# Patient Record
Sex: Male | Born: 1955 | Race: White | Hispanic: No | Marital: Married | Smoking: Former smoker
Health system: Southern US, Community
[De-identification: ages and names within clinical notes are randomized; demographics above are authoritative.]

## PROBLEM LIST (undated history)

## (undated) DIAGNOSIS — Z9889 Other specified postprocedural states: Secondary | ICD-10-CM

## (undated) DIAGNOSIS — E119 Type 2 diabetes mellitus without complications: Secondary | ICD-10-CM

## (undated) DIAGNOSIS — I499 Cardiac arrhythmia, unspecified: Secondary | ICD-10-CM

## (undated) DIAGNOSIS — I4891 Unspecified atrial fibrillation: Secondary | ICD-10-CM

## (undated) HISTORY — DX: Type 2 diabetes mellitus without complications: E11.9

## (undated) HISTORY — DX: Unspecified atrial fibrillation: I48.91

---

## 2002-11-06 ENCOUNTER — Ambulatory Visit (HOSPITAL_COMMUNITY): Admission: RE | Admit: 2002-11-06 | Discharge: 2002-11-06 | Payer: Self-pay | Admitting: Otolaryngology

## 2002-11-06 ENCOUNTER — Encounter: Payer: Self-pay | Admitting: Otolaryngology

## 2008-06-23 ENCOUNTER — Encounter: Payer: Self-pay | Admitting: Cardiology

## 2008-07-31 ENCOUNTER — Ambulatory Visit: Payer: Self-pay | Admitting: Cardiology

## 2008-07-31 ENCOUNTER — Encounter: Payer: Self-pay | Admitting: Cardiology

## 2008-07-31 ENCOUNTER — Ambulatory Visit (HOSPITAL_COMMUNITY): Admission: RE | Admit: 2008-07-31 | Discharge: 2008-07-31 | Payer: Self-pay | Admitting: Cardiology

## 2008-08-07 ENCOUNTER — Ambulatory Visit: Payer: Self-pay | Admitting: Cardiology

## 2008-08-08 ENCOUNTER — Ambulatory Visit: Payer: Self-pay | Admitting: Cardiology

## 2008-08-12 ENCOUNTER — Encounter (HOSPITAL_COMMUNITY): Admission: RE | Admit: 2008-08-12 | Discharge: 2008-08-27 | Payer: Self-pay | Admitting: Cardiology

## 2008-08-12 ENCOUNTER — Ambulatory Visit: Payer: Self-pay | Admitting: Cardiology

## 2008-08-20 ENCOUNTER — Ambulatory Visit: Payer: Self-pay | Admitting: Cardiology

## 2008-08-28 ENCOUNTER — Ambulatory Visit (HOSPITAL_BASED_OUTPATIENT_CLINIC_OR_DEPARTMENT_OTHER): Admission: RE | Admit: 2008-08-28 | Discharge: 2008-08-28 | Payer: Self-pay | Admitting: Family Medicine

## 2008-09-07 ENCOUNTER — Ambulatory Visit: Payer: Self-pay | Admitting: Internal Medicine

## 2009-02-09 DIAGNOSIS — I4891 Unspecified atrial fibrillation: Secondary | ICD-10-CM

## 2009-07-27 ENCOUNTER — Telehealth: Payer: Self-pay | Admitting: Cardiology

## 2009-10-09 ENCOUNTER — Ambulatory Visit: Payer: Self-pay | Admitting: Cardiology

## 2009-10-09 DIAGNOSIS — E669 Obesity, unspecified: Secondary | ICD-10-CM

## 2009-10-09 DIAGNOSIS — Z91199 Patient's noncompliance with other medical treatment and regimen due to unspecified reason: Secondary | ICD-10-CM | POA: Insufficient documentation

## 2009-10-09 DIAGNOSIS — Z9119 Patient's noncompliance with other medical treatment and regimen: Secondary | ICD-10-CM

## 2009-11-06 ENCOUNTER — Encounter: Payer: Self-pay | Admitting: Cardiology

## 2009-11-06 ENCOUNTER — Ambulatory Visit: Payer: Self-pay | Admitting: Cardiology

## 2009-11-06 ENCOUNTER — Ambulatory Visit (HOSPITAL_COMMUNITY): Admission: RE | Admit: 2009-11-06 | Discharge: 2009-11-06 | Payer: Self-pay | Admitting: Cardiology

## 2009-11-09 ENCOUNTER — Encounter: Payer: Self-pay | Admitting: Cardiology

## 2009-11-09 DIAGNOSIS — R0602 Shortness of breath: Secondary | ICD-10-CM | POA: Insufficient documentation

## 2010-01-29 ENCOUNTER — Telehealth (INDEPENDENT_AMBULATORY_CARE_PROVIDER_SITE_OTHER): Payer: Self-pay

## 2010-08-21 IMAGING — NM NM MYOCAR MULTI W/ SPECT
2 series · 12 of 12 positions shown · non-contrast
Comparison: none

08/13/08 - DUPLICATE COPY for exam association in [facility] – No change from original report.
 Ordering Physician: Fadumo Baber

 [place] Physician: [REDACTED]al Data: 52-year-old gentleman with paroxysmal atrial
 fibrillation.
 NUCLEAR MEDICINE STRESS MYOVIEW STUDY WITH SPECT AND LEFT
 VENTRICULAR EJECTION FRACTION
 Radionuclide Data: One-day rest/stress protocol performed with
 [DATE] mCi of Uc-DDm Myoview.
 Stress Data: Treadmill exercise performed to a workload of 11 mets
 and a heart rate of 162, 96% of age - predicted maximum. Exercise
 discontinued due to dyspnea and fatigue; no chest pain reported.
 Blood pressure increased from a resting value of 130/90 to 170/80
 during exercise and 180/80 in recovery, a normal response. No
 arrhythmias noted.
 EKG: Normal sinus rhythm; incomplete right bundle branch block;
 borderline first-degree AV block; otherwise normal.
 Stress EKG: Insignificant upsloping ST-segment depression.
 Scintigraphic Data: Acquisition notable for moderate diaphragmatic
 attenuation. The left ventricular size was normal. The right
 ventricle was dilated. On tomographic images reconstructed in
 standard planes, there was a small mild defect in the mid anterior
 wall. This was unchanged on the resting images. The gated
 reconstruction demonstrated normal regional and global LV systolic
 function as well as normal systolic accentuation of activity
 throughout. Estimated ejection fraction was 66%.

[Series 1: cr cardiac tc low dose · 6.52mm/px · 6 of 64 frames shown]
[frame 6/64]
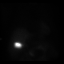
[frame 16/64]
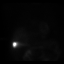
[frame 27/64]
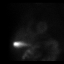
[frame 38/64]
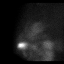
[frame 48/64]
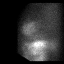
[frame 59/64]
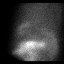

[Series 1: cs cardiac tc hi dose · 6.52mm/px · 6 of 512 frames shown]
[frame 43/512]
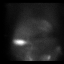
[frame 128/512]
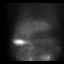
[frame 214/512]
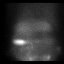
[frame 299/512]
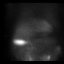
[frame 384/512]
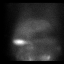
[frame 470/512]
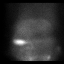

[12 of 12 positions shown; findings below may reference images not displayed]

IMPRESSION: Negative stress nuclear myocardial study revealing no significant
 stress - induced EKG abnormalities, normal left ventricular size
 and normal left ventricular systolic function. By scintigraphic
 imaging, there was normal myocardial perfusion except for a small
 defect consistent with a right ventricular insertion artifact.
 Right ventricular enlargement noted. Other findings as described.

## 2010-09-28 NOTE — Progress Notes (Signed)
**Note De-Identified Bohdi Leeds Obfuscation** Summary: RX REFILL   Phone Note Call from Patient Call back at Home Phone [phone]   Caller: PT Reason for Call: Refill Medication Summary of Call: PT NEEDS FLONASE CALLED INTO [facility]    Prescriptions: FLONASE 50 MCG/ACT SUSP (FLUTICASONE PROPIONATE) one spray each nostril daily  #16 Gram x 1   Entered by:   Larita Fife Raysa Bosak LPN   Authorized by:   Gaylord Shih, MD, [facility]   Signed by:   Larita Fife Maya Scholer LPN on 96/29/5284   Method used:   Electronically to        [facility] # [phone]* (retail)       [address]       [place], [place]  [address]       Ph: [phone]       Fax: [phone]   RxID:   [id number]

## 2010-09-28 NOTE — Miscellaneous (Signed)
Summary: Orders Update  Clinical Lists Changes  Problems: Added new problem of DYSPNEA (ICD-786.05) Orders: Added new Test order of T-D-Dimer Fibrin Derivatives Quantitive [phone]) - Signed

## 2010-09-28 NOTE — Assessment & Plan Note (Signed)
Summary: ROV  Medications Added DILTIAZEM HCL ER BEADS 360 MG XR24H-CAP (DILTIAZEM HCL ER BEADS) take 1 tablet by mouth once daily ASPIRIN EC 325 MG TBEC (ASPIRIN) Take one tablet by mouth daily FISH OIL 1000 MG CAPS (OMEGA-3 FATTY ACIDS) Take 1 tablet by mouth once a day ONE-A-DAY WEIGHT SMART ADVANCE  TABS (MULTIPLE VITAMINS-MINERALS) Take 1 tablet by mouth once a day ZYRTEC ALLERGY 10 MG TABS (CETIRIZINE HCL) Take 1 tablet by mouth once a day ASPIRIN 81 MG TBEC (ASPIRIN) Take one tablet by mouth daily      Allergies Added: NKDA  Visit Type:  ROV Primary Provider:  Dr. Selinda Flavin  CC:  no c/o.  History of Present Illness: Mr Klarich returns today for further evaluation and management of his paroxysmal atrial fibrillation. He denies any palpitations or irregular heartbeat.  He's gained about 35 pounds since we last saw him about 13 months ago. He does not have bathroom scale  He seems to be compliant with his medications that he does not check his blood pressure.  Looking back at his chart, he had a normal 2-D echocardiogram we first met about a year and half ago.  Current Medications (verified): 1)  Diltiazem Hcl Er Beads 360 Mg Xr24h-Cap (Diltiazem Hcl Er Beads) .... Take 1 Tablet By Mouth Once Daily 2)  Flonase 50 Mcg/act Susp (Fluticasone Propionate) .... One Spray Each Nostril Daily 3)  Aspirin Ec 325 Mg Tbec (Aspirin) .... Take One Tablet By Mouth Daily 4)  Fish Oil 1000 Mg Caps (Omega-3 Fatty Acids) .... Take 1 Tablet By Mouth Once A Day 5)  One-A-Day Weight Smart Advance  Tabs (Multiple Vitamins-Minerals) .... Take 1 Tablet By Mouth Once A Day 6)  Zyrtec Allergy 10 Mg Tabs (Cetirizine Hcl) .... Take 1 Tablet By Mouth Once A Day 7)  Aspirin 81 Mg Tbec (Aspirin) .... Take One Tablet By Mouth Daily  Allergies (verified): No Known Drug Allergies  Past History:  Past Medical History: Last updated: 02/09/2009 ATRIAL FIBRILLATION (ICD-427.31)    Family History: Last  updated: 02/09/2009 CHF Heart disease  Social History: Last updated: 02/09/2009 Married  Tobacco Use - No.  Regular Exercise - no Drug Use - no  Risk Factors: Exercise: no (02/09/2009)  Risk Factors: Smoking Status: never (02/09/2009)  Review of Systems       negative other than history of present illness  Vital Signs:  Patient profile:   55 year old male Height:      77 inches Weight:      336 pounds BMI:     39.99 O2 Sat:      96 % on Room air Pulse rate:   97 / minute BP sitting:   126 / 90  (left arm)  Vitals Entered By: Teressa Lower RN (October 09, 2009 1:17 PM)  Nutrition Counseling: Patient's BMI is greater than 25 and therefore counseled on weight management options.  O2 Flow:  Room air  Physical Exam  General:  obese.   Head:  normocephalic and atraumatic Eyes:  wears glass Mouth:  poor dentition.   Neck:  Neck supple, no JVD. No masses, thyromegaly or abnormal cervical nodes. Lungs:  Clear bilaterally to auscultation and percussion. Heart:  irregular rate and rhythm, right less than 100. PMI difficult to appreciate. S2 split Msk:  decreased ROM.   Pulses:  pulses normal in all 4 extremities Extremities:  trace left pedal edema and trace right pedal edema.   Neurologic:  Alert and oriented x  3. Skin:  Intact without lesions or rashes. Psych:  Normal affect.   EKG  Procedure date:  10/09/2009  Findings:      echo for ablation, rate 90-100 beats per minute. No ST segment changes.  Impression & Recommendations:  Problem # 1:  ATRIAL FIBRILLATION (ICD-427.31)  He is in atrial fibrillation which is asymptomatic with a fairly well controlled ventricular rate. I suspect with any activity however his rate increases rheumatically. We will start by increasing his diltiazem to 360 mg a day for both ventricular rate control and blood pressure. However return to see me in about 4-6 weeks at which time we'll perform a 2-D echocardiogram. In addition he  will remain on aspirin 325 mg per day. if he doesn't convert, we may need to consider TE cardioversion. He'll not start Coumadin today with a fairly low [place] score. His updated medication list for this problem includes:    Aspirin Ec 325 Mg Tbec (Aspirin) .Marland Kitchen... Take one tablet by mouth daily    Aspirin 81 Mg Tbec (Aspirin) .Marland Kitchen... Take one tablet by mouth daily  Orders: 2-D Echocardiogram (2D Echo)  Problem # 2:  OBESITY (ICD-278.00) Assessment: Deteriorated This is a major problem. I spent extensive time talking to him about the importance of weight reduction in terms of blood pressure control, orthopedic issues, the risk of developing diabetes etc.  Problem # 3:  PERS HX NONCOMPLIANCE W/MED TX PRS HAZARDS HLTH (ICD-V15.81)  Patient Instructions: 1)  Your physician recommends that you schedule a follow-up appointment in: 4 weeks 2)  Your physician has recommended you make the following change in your medication: Increase Diltiazem 360mg  and start taking Aspirin 325mg  by mouth once daily  3)  Your physician has requested that you have an echocardiogram.  Echocardiography is a painless test that uses sound waves to create images of your heart. It provides your doctor with information about the size and shape of your heart and how well your heart's chambers and valves are working.  This procedure takes approximately one hour. There are no restrictions for this procedure. 4)  Your physician encouraged you to lose weight for better health. Prescriptions: DILTIAZEM HCL ER BEADS 360 MG XR24H-CAP (DILTIAZEM HCL ER BEADS) take 1 tablet by mouth once daily  #30 x 6   Entered by:   Larita Fife Via LPN   Authorized by:   Gaylord Shih, MD, [facility]   Signed by:   Larita Fife Via LPN on 57/84/6962   Method used:   Electronically to        [facility] # [phone]* (retail)       [address]       [place], [place]  [address]       Ph: [phone]       Fax: [phone]   RxID:   [id number]

## 2010-09-28 NOTE — Assessment & Plan Note (Signed)
Summary: ROV      Allergies Added: NKDA  Visit Type:  Follow-up Primary Provider:  Dr. Selinda Flavin  CC:  no complaints today.  History of Present Illness: Ryan Lowe returns today for his paroxysmal atrial fibrillation and obesity.  He is still totally asymptomatic. He has lost 12 pounds since February. He seems to be quite motivated.  Unfortunately, his echocardiogram showed EF of 50% with mild LVH. He has a moderately dilated left atrium, moderate to severe dilatation the right ventricle, severely reduced systolic function the right ventricle, moderate severe dilatation the right atrium, and peak pressure of 34 mm of mercury.  He denies any chest pain, hemoptysis, or significant shortness of breath. He denies any edema or leg pain.  Current Medications (verified): 1)  Diltiazem Hcl Er Beads 360 Mg Xr24h-Cap (Diltiazem Hcl Er Beads) .... Take 1 Tablet By Mouth Once Daily 2)  Flonase 50 Mcg/act Susp (Fluticasone Propionate) .... One Spray Each Nostril Daily 3)  Aspirin Ec 325 Mg Tbec (Aspirin) .... Take One Tablet By Mouth Daily 4)  Fish Oil 1000 Mg Caps (Omega-3 Fatty Acids) .... Take 1 Tablet By Mouth Once A Day 5)  One-A-Day Weight Smart Advance  Tabs (Multiple Vitamins-Minerals) .... Take 1 Tablet By Mouth Once A Day 6)  Zyrtec Allergy 10 Mg Tabs (Cetirizine Hcl) .... Take 1 Tablet By Mouth Once A Day  Allergies (verified): No Known Drug Allergies  Past History:  Past Medical History: Last updated: 02/09/2009 ATRIAL FIBRILLATION (ICD-427.31)    Family History: Last updated: 02/09/2009 CHF Heart disease  Social History: Last updated: 02/09/2009 Married  Tobacco Use - No.  Regular Exercise - no Drug Use - no  Risk Factors: Exercise: no (02/09/2009)  Risk Factors: Smoking Status: never (02/09/2009)  Review of Systems       negative other than history of present illness  Vital Signs:  Patient profile:   55 year old male Weight:      324 pounds Pulse  rate:   88 / minute BP sitting:   107 / 63  (right arm)  Vitals Entered By: Dreama Saa, CNA (November 06, 2009 1:52 PM)  Physical Exam  General:  obese.   Head:  normocephalic and atraumatic Eyes:  PERRLA/EOM intact; conjunctiva and lids normal. Neck:  Neck supple, no JVD. No masses, thyromegaly or abnormal cervical nodes. Lungs:  Clear bilaterally to auscultation and percussion. Heart:  he regular rate and rhythm, PMI heart appreciate, no obvious right ventricular lift Msk:  Back normal, normal gait. Muscle strength and tone normal. Pulses:  pulses normal in all 4 extremities Extremities:  trace left pedal edema and trace right pedal edema.   Neurologic:  Alert and oriented x 3. Skin:  Intact without lesions or rashes. Psych:  Normal affect.   Impression & Recommendations:  Problem # 1:  ATRIAL FIBRILLATION (ICD-427.31) Assessment Improved His ventricular rate is much better control. He is totally asymptomatic. With his right atrial enlargement, moderate pulmonary hypertension, dilatation his right ventricle, I'm concerned that this is related to his obesity. There is no history to suggest a pulmonary embolus or thromboembolic disease.  He has lost 12 pounds and seems committed to get his weight well below 300. At this time we'll continue with aspirin and diltiazem. Cardioversion would most likely be unsuccessful long-term for even short term. I not sure that antiarrhythmic therapy would be successful either let her use something like amiodarone. We'll hold off for now. The following medications were removed from the  medication list:    Aspirin 81 Mg Tbec (Aspirin) .Marland Kitchen... Take one tablet by mouth daily His updated medication list for this problem includes:    Aspirin Ec 325 Mg Tbec (Aspirin) .Marland Kitchen... Take one tablet by mouth daily  Problem # 2:  OBESITY (ICD-278.00) Assessment: Improved  Patient Instructions: 1)  Your physician recommends that you schedule a follow-up appointment  in: June 2)  Your physician recommends that you continue on your current medications as directed. Please refer to the Current Medication list given to you today.  Appended Document: ROV His Echo in 2009 showed only mild RV enlargement. Since then has gained 35 lbs. Will check a d-dimer .

## 2011-01-11 NOTE — Assessment & Plan Note (Signed)
[facility]                       [place] [facility] OFFICE NOTE   Ryan Lowe, Ryan Lowe                        MRN:          [id number]  DATE:08/20/2008                            DOB:          04/22/1956    Mr. Vi comes in today for followup.   Since starting diltiazem extended release 240 mg a day, he feels  remarkably better.  He has had 1 little brief episode of atrial fib,  otherwise been normal.  He is not taking his Zyrtec-D.  At the present  time, we would like to start that back.  I told him, I would rather, he  talk to Dr. Dimas Aguas, consider a pulmonary consultation.  He would also  like to sleep consultation as well.  I have offered to set him up in  pulmonary in our Sleep Lab at [facility].  He can also go to Dr. Dimas Aguas.   His exercise stress test showed good exercise tolerance with a met level  of 11, achieving a heart rate of 162 which is 96% of age-predicted  maximum, normal blood pressure response, no arrhythmias, EF 66%, no  ischemia or infarction.   PHYSICAL EXAMINATION:  VITAL SIGNS:  His blood pressure today is 128/82,  his pulse is 76 and regular, and his weight is 305.  The rest of his  exam is unchanged.   I have answered multiple questions for Mr. Coate and his wife.  I have  advised him to continue his diltiazem extended release for now.  If he  begins to breakthrough on a regular basis, we can go to a tier II with a  drug such as flecainide or propafenone.  I will see him back in 3 months  for followup concerning this issue.   Before he left, he and his wife wanted me to set up an appointment with  [facility], [facility] to evaluate the above issues.  We will  try to get him in as soon as possible.     Thomas C. Daleen Squibb, MD, [facility]  Electronically Signed    TCW/MedQ  DD: 08/20/2008  DT: 08/20/2008  Job #: [id number]   cc:   Selinda Flavin

## 2011-01-11 NOTE — Assessment & Plan Note (Signed)
[facility]                       [place] CARDIOLOG[facility] OFFICE NOTE   WILHELM, GANAWAY                        MRN:          [id number]  DATE:08/08/2008                            DOB:          08-12-1956    Ryan Lowe comes in today for close followup.  Please see my note from  July 31, 2008.   We had placed an event recorder which documents atrial fibrillation with  a rapid ventricular rate.  During his 23 hours and 7 minutes of wearing  the monitor, he had a total of 2 hours and 58 minutes of atrial fib.  His longest duration was 2 minutes at 11:40 at night.  His highest heart  rate was 180, but most strips look like about 140-160.   We stopped his Zyrtec-D once we saw this recording.  He is taking  pseudoephedrine derivatives for years for chronic allergies and  congestion.  Since stopping this, he has noticed a decrease in the  events, but he still had 1 this week.   We also performed a 2-D echocardiogram to rule out any structural heart  disease.  This shows normal left ventricular chamber size, mild left  atrial enlargement of 48-mm (but he is a large man by the way), mild  fibrocalcific changes of the aortic root and mild calcification of  aortic valve, but no stenosis, no significant mitral valve  regurgitation, and normal right-sided pressures.   His exam today shows a blood pressure of 120/90.  His pulse is 66 and  regular.  His weight is 298.   I have spent about 30 minutes this morning discussing with him and his  wife, his diagnosis of paroxysmal atrial fibrillation, rapid ventricular  rate which is very symptomatic.  He is sensing that he can predict some  of these spells is outlined in my first note.  Some of them he cannot  predict, however.   We talked about trying a first-year antiarrhythmic with diltiazem.  I  would be reluctant to try a beta-blocker with his history of respiratory  abnormalities.  He does not have any  history of asthma, however.   If he fails this, we would probably need to move to tier-2 with  antiarrhythmics.  Saying that this is highly possible, we need to rule  out any significant coronary artery disease.  We will therefore set him  up for an exercise rest/stress Myoview next week.   He will continue with aspirin for the present time.   PLAN:  1. Begin diltiazem extended release 240 mg a day.  2. Continue not to take Zyrtec-D.  We switched him to Flonase.  3. Exercise rest/stress Myoview next week.  4. Follow up with me in the week of Christmas.  If he continues to      breakthrough and he has a negative stress Myoview, we will start      with outpatient flecainide.     Thomas C. Daleen Squibb, MD, [facility]  Electronically Signed    TCW/MedQ  DD: 08/08/2008  DT: 08/09/2008  Job #: [id number]   cc:   Ryan Lowe  Ryan Lowe

## 2011-01-11 NOTE — Assessment & Plan Note (Signed)
[facility]                       [place] C[facility]IOLOG[facility] OFFICE NOTE   Ryan Lowe, Ryan Lowe                        MRN:          [id number]  DATE:07/31/2008                            DOB:          1956/04/29    I was asked by Dr. Selinda Flavin to consult on Ryan Lowe with the chief  complaint of palpitations.   [name] is a 55 year old married white male who comes in today  with the above complaint.  He has had these over the past several years,  but they have gotten progressively worse.  He describes them as  occurring when he is really doing not much of anything, usually late  afternoon or early evening.  These, however, wake him up at night.  They  generally come on quite suddenly, and he feels like his heart is  speeding fast and irregular.  They can last upto 24 hours.  He becomes  lightheaded and sometimes sweaty.  He has not had syncope.  He has had  no chest discomfort or angina.   He notes that it is particularly likely to happen if he eats dessert or  anything with a lot of sugar in it.  It is also true when he eats  chocolate.  He has particularly found to have these when he eats  anything at the [employer].   He is seeing Dr. Dimas Aguas, who performed an EKG, which is normal except  for complete right bundle.  Specifically, his PR, QRS, and QTC intervals  are normal.  His blood work was also unremarkable including a CBC,  potassium was normal, and a TSH was normal.   He has been taking Zyrtec-D 1 p.o. b.i.d. for allergies.  He has been  taking some sort of Sudafed component for the last 20 years, off and on.  He is concerned this might be triggering as well.   PAST MEDICAL HISTORY:   ALLERGIES:  He has no known drug allergies.   MEDICATIONS:  He is currently on;  1. Aspirin 325 mg alternating with 81 mg every other day.  2. Fish oil 1200 mg a day.  3. Vitamin D3.  4. Vitamin C.  5. Zyrtec-D 1 p.o. b.i.d.   SOCIAL  HISTORY:  He does not smoke.  He does not drink.   PAST SURGICAL HISTORY:  Only surgery is a colonoscopy.   FAMILY HISTORY:  Significant for some heart failure in mother and  father.  He has 4 sisters, who all have heart issues.   SOCIAL HISTORY:  He is unemployed.  He is married.  He has 1 child.  His  wife is with him today.   REVIEW OF SYSTEMS:  He wears glasses.  He has urinary frequency and  urgency, particularly associated with these spells or after these  spells.   The rest review of systems are negative and all are questioned.   PHYSICAL EXAMINATION:  VITAL SIGNS:  His blood pressure is 110/80.  His  pulse is 80 and regular.  He is 6 feet 5 inches and weight is 298  pounds.  GENERAL:  He is in no acute distress.  He is pleasant.  HEENT:  Normocephalic and atraumatic.  PERRLA.  Extraocular movements  are intact.  Sclerae are clear.  Facial symmetry is normal.  Dentition  is satisfactory.  Oral mucosa normal.  NECK:  Supple.  Carotids upstrokes are equal bilaterally without bruits.  No JVD.  Thyroid is not enlarged.  Trachea is midline.  LUNGS:  Clear to auscultation and percussion.  HEART:  Poorly appreciated PMI.  He has a soft S1 and S2.  No obvious  murmur, gallop, or rub.  ABDOMEN:  Protuberant.  Good bowel sounds.  No midline bruit.  No  tenderness.  EXTREMITIES:  There is no cyanosis, clubbing, or edema.  Pulses are  intact.  NEUROLOGIC:  Intact.  SKIN:  Unremarkable.   ASSESSMENT:  Clinical supraventricular tachycardia, though he states he  feels somewhat irregular.  All the other associated features seemed to  suggest this, however.  It could be atrial flutter or it could even be  atrial fibrillation as well.   PLAN:  1. A 2-D echocardiogram to rule out any structural heart disease.  2. Event recorder.   I will schedule followup after he has a couple of spells that we can  monitor.  We will not change any medications at present including his  Zyrtec-D.    We have had a long talk today about possibility of ablation being a  potentially curative procedure.  All questions were answered.     Thomas C. Daleen Squibb, MD, [facility]  Electronically Signed    TCW/MedQ  DD: 07/31/2008  DT: 08/01/2008  Job #: [id number]   cc:   Selinda Flavin

## 2011-01-14 NOTE — Procedures (Signed)
NAME:  Ryan Lowe, MATTERS NO.:  [id number]   MEDICAL RECORD NO.:  [id number]          PATIENT TYPE:  OUT   LOCATION:  SLEEP CENTER                 FACILITY:  [facility]   PHYSICIAN:  Clinton D. Maple Hudson, MD, FCCP, FACPDATE OF BIRTH:  08-13-1956   DATE OF STUDY:                            NOCTURNAL POLYSOMNOGRAM   REFERRING PHYSICIAN:   REFERRING PHYSICIAN:  Dr. Selinda Flavin   INDICATION FOR STUDY:  Hypersomnia with sleep apnea.   EPWORTH SLEEPINESS SCORE:  Epworth sleepiness score 5/24.  BMI 36.  Weight 300 pounds.  Height 77 inches.  Neck 17.5 inches.   MEDICATIONS:  Home medications are charted and reviewed.   SLEEP ARCHITECTURE:  Total sleep time 304 minutes with sleep efficiency  74.2%.  Stage I was 5.1%.  Stage II 65%.  Stage III absent.  REM 29.9%  of total sleep time.  Sleep latency 82 minutes.  REM latency 64 minutes.  Awake after sleep onset 11.5 minutes.  Arousal index 23.8.  No bedtime  medication was taken.   RESPIRATORY DATA:  Apnea-hypopnea index (AHI) 9.1 per hour.  A total of  46 events were scored including 11 obstructive apneas, 1 mixed apnea,  and 34 hypopneas.  Events were not positional but more common while in  supine as expected.  There were insufficient events in the early part of  the night permit CPAP titration by split protocol on the study night.   OXYGEN DATA:  Loud snoring with oxygen desaturation to a nadir of 86%.  Mean oxygen saturation through the study was 95% on room air.   CARDIAC DATA:  Normal sinus rhythm.   MOVEMENT-PARASOMNIA:  No significant movement disturbance.  Bathroom x2.   IMPRESSIONS-RECOMMENDATIONS:  1. Mild obstructive sleep apnea/hypopnea syndrome, apnea-hypopnea      index 9.1 per hour.  Events were noted in all sleep positions but      more common while supine.  Loud snoring with oxygen desaturation to      a nadir of 86%.  2. There were insufficient early events to permit CPAP titration by      split-study  protocol on this night.  Consider return for CPAP      titration or evaluate for alternative management as appropriate.      Clinton D. Maple Hudson, MD, [facility], FACP  Diplomate, Biomedical engineer of Sleep Medicine  Electronically Signed     CDY/MEDQ  D:  09/06/2008 10:28:00  T:  09/06/2008 23:47:25  Job:  [id number]

## 2011-03-25 ENCOUNTER — Other Ambulatory Visit: Payer: Self-pay | Admitting: Cardiology

## 2011-08-25 ENCOUNTER — Encounter: Payer: Self-pay | Admitting: Cardiology

## 2011-09-02 ENCOUNTER — Other Ambulatory Visit: Payer: Self-pay | Admitting: Cardiology

## 2011-09-02 NOTE — Telephone Encounter (Signed)
Follow-up:    Patient's wife called wanting to make sure we received the fax for her husbands refill of TAZTIA XT 360 MG 24 hr capsule and would like a call when the medication has been filled. Please call back.

## 2012-02-02 ENCOUNTER — Telehealth (HOSPITAL_COMMUNITY): Payer: Self-pay

## 2012-02-02 ENCOUNTER — Other Ambulatory Visit: Payer: Self-pay | Admitting: Cardiology

## 2012-02-02 NOTE — Telephone Encounter (Signed)
..   Requested Prescriptions   Pending Prescriptions Disp Refills  . TAZTIA XT 360 MG 24 hr capsule [Pharmacy Med Name: TAZTIA XT 360MG /24HRCAP] 30 each 2    Sig: TAKE ONE CAPSULE BY MOUTH EVERY DAY

## 2012-02-02 NOTE — Telephone Encounter (Signed)
call patient and left message to call office to make appointment

## 2012-04-19 ENCOUNTER — Encounter: Payer: Self-pay | Admitting: Adult Health

## 2012-04-19 ENCOUNTER — Encounter (INDEPENDENT_AMBULATORY_CARE_PROVIDER_SITE_OTHER): Payer: 59 | Admitting: Adult Health

## 2012-04-19 VITALS — BP 105/79 | HR 86 | Ht 77.0 in | Wt 342.0 lb

## 2012-04-19 DIAGNOSIS — I4891 Unspecified atrial fibrillation: Secondary | ICD-10-CM

## 2012-04-19 MED ORDER — DILTIAZEM HCL ER BEADS 360 MG PO CP24: 360.0000 mg | ORAL_CAPSULE | Freq: Every day | ORAL | Status: DC

## 2012-04-19 NOTE — Patient Instructions (Addendum)
Your physician wants you to follow-up in:6 months with Dr Wall. You will receive a reminder letter in the mail two months in advance. If you don't receive a letter, please call our office to schedule the follow-up appointment.  Your physician recommends that you continue on your current medications as directed. Please refer to the Current Medication list given to you today.  

## 2012-04-19 NOTE — Progress Notes (Signed)
NO Show

## 2012-04-19 NOTE — Assessment & Plan Note (Signed)
The patient has very little complaint of dyspnea at this time. He should followup with his primary care physician for continued evaluation and management of same, with recommendations to repeat sleep study.

## 2012-04-19 NOTE — Assessment & Plan Note (Signed)
Ryan Lowe is rate controlled currently. He is questioning other alternatives for treatment of atrial fibrillation. I have discussed with him the possibility of atrial fib ablation which would be completed to electrophysiology. However he does have a history of sleep apnea which has not been treated. I doubt that ablation would be beneficial and pelvis sleep apnea is treated. I have given him a copy of his sleep study from 2010 to get his primary care physician, for further discussion and need to repeat sleep study should this be necessary. For now the patient will continue on diltiazem as directed.    Echocardiogram completed in March of 2011 revealed an EF of 50% with regional wall motion abnormalities the could not be excluded. His left atrium was moderately dilated. Peak PA pressure was 34 mmHg. Right ventricular systolic function was moderately to severely reduced.

## 2013-05-31 ENCOUNTER — Other Ambulatory Visit: Payer: Self-pay | Admitting: *Deleted

## 2013-05-31 MED ORDER — DILTIAZEM HCL ER BEADS 360 MG PO CP24: 360.0000 mg | ORAL_CAPSULE | Freq: Every day | ORAL | Status: DC

## 2013-06-07 ENCOUNTER — Ambulatory Visit: Payer: 59 | Admitting: Adult Health

## 2020-12-21 HISTORY — PX: COLON RESECTION: SHX5231

## 2021-09-29 DIAGNOSIS — E7800 Pure hypercholesterolemia, unspecified: Secondary | ICD-10-CM | POA: Diagnosis not present

## 2021-09-29 DIAGNOSIS — I471 Supraventricular tachycardia: Secondary | ICD-10-CM | POA: Diagnosis not present

## 2021-09-29 DIAGNOSIS — I4891 Unspecified atrial fibrillation: Secondary | ICD-10-CM | POA: Diagnosis not present

## 2021-09-29 DIAGNOSIS — J069 Acute upper respiratory infection, unspecified: Secondary | ICD-10-CM | POA: Diagnosis not present

## 2021-09-29 DIAGNOSIS — Z299 Encounter for prophylactic measures, unspecified: Secondary | ICD-10-CM | POA: Diagnosis not present

## 2021-10-20 ENCOUNTER — Emergency Department (HOSPITAL_COMMUNITY)
Admission: EM | Admit: 2021-10-20 | Discharge: 2021-10-20 | Disposition: A | Discharge status: Home / Self Care | Payer: Medicare Other | Attending: Emergency Medicine | Admitting: Emergency Medicine

## 2021-10-20 ENCOUNTER — Emergency Department (HOSPITAL_COMMUNITY): Payer: Medicare Other

## 2021-10-20 ENCOUNTER — Other Ambulatory Visit: Payer: Self-pay

## 2021-10-20 ENCOUNTER — Encounter (HOSPITAL_COMMUNITY): Payer: Self-pay

## 2021-10-20 DIAGNOSIS — Z743 Need for continuous supervision: Secondary | ICD-10-CM | POA: Diagnosis not present

## 2021-10-20 DIAGNOSIS — Z79899 Other long term (current) drug therapy: Secondary | ICD-10-CM | POA: Diagnosis not present

## 2021-10-20 DIAGNOSIS — I499 Cardiac arrhythmia, unspecified: Secondary | ICD-10-CM | POA: Diagnosis not present

## 2021-10-20 DIAGNOSIS — E1165 Type 2 diabetes mellitus with hyperglycemia: Secondary | ICD-10-CM | POA: Diagnosis not present

## 2021-10-20 DIAGNOSIS — N451 Epididymitis: Secondary | ICD-10-CM | POA: Insufficient documentation

## 2021-10-20 DIAGNOSIS — N3289 Other specified disorders of bladder: Secondary | ICD-10-CM | POA: Diagnosis not present

## 2021-10-20 DIAGNOSIS — D696 Thrombocytopenia, unspecified: Secondary | ICD-10-CM | POA: Diagnosis not present

## 2021-10-20 DIAGNOSIS — R1030 Lower abdominal pain, unspecified: Secondary | ICD-10-CM | POA: Diagnosis present

## 2021-10-20 DIAGNOSIS — K821 Hydrops of gallbladder: Secondary | ICD-10-CM | POA: Diagnosis not present

## 2021-10-20 DIAGNOSIS — Z299 Encounter for prophylactic measures, unspecified: Secondary | ICD-10-CM | POA: Diagnosis not present

## 2021-10-20 LAB — COMPREHENSIVE METABOLIC PANEL
ALT: 10 U/L (ref 0–44)
AST: 17 U/L (ref 15–41)
Albumin: 3.4 g/dL — ABNORMAL LOW (ref 3.5–5.0)
Alkaline Phosphatase: 53 U/L (ref 38–126)
Anion gap: 7 (ref 5–15)
BUN: 23 mg/dL (ref 8–23)
CO2: 24 mmol/L (ref 22–32)
Calcium: 9.3 mg/dL (ref 8.9–10.3)
Chloride: 103 mmol/L (ref 98–111)
Creatinine, Ser: 1 mg/dL (ref 0.61–1.24)
GFR, Estimated: >60 mL/min (ref >60)
Glucose, Bld: 153 mg/dL — ABNORMAL HIGH (ref 70–99)
Potassium: 3.8 mmol/L (ref 3.5–5.1)
Sodium: 134 mmol/L — ABNORMAL LOW (ref 135–145)
Total Bilirubin: 3.7 mg/dL — ABNORMAL HIGH (ref 0.3–1.2)
Total Protein: 7.3 g/dL (ref 6.5–8.1)

## 2021-10-20 LAB — CBC WITH DIFFERENTIAL/PLATELET
Band Neutrophils: 4 %
Basophils Absolute: 0 10*3/uL (ref 0.0–0.1)
Basophils Relative: 0 %
Eosinophils Absolute: 0 10*3/uL (ref 0.0–0.5)
Eosinophils Relative: 0 %
HCT: 42.2 % (ref 39.0–52.0)
Hemoglobin: 13.7 g/dL (ref 13.0–17.0)
Lymphocytes Relative: 4 %
Lymphs Abs: 0.8 10*3/uL (ref 0.7–4.0)
MCH: 29.9 pg (ref 26.0–34.0)
MCHC: 32.5 g/dL (ref 30.0–36.0)
MCV: 92.1 fL (ref 80.0–100.0)
Metamyelocytes Relative: 13 %
Monocytes Absolute: 1.2 10*3/uL — ABNORMAL HIGH (ref 0.1–1.0)
Monocytes Relative: 6 %
Myelocytes: 3 %
Neutro Abs: 14.9 10*3/uL — ABNORMAL HIGH (ref 1.7–7.7)
Neutrophils Relative %: 70 %
Platelets: 195 10*3/uL (ref 150–400)
RBC: 4.58 MIL/uL (ref 4.22–5.81)
RDW: 13.4 % (ref 11.5–15.5)
WBC: 20.2 10*3/uL — ABNORMAL HIGH (ref 4.0–10.5)
nRBC: 0 % (ref 0.0–0.2)

## 2021-10-20 LAB — URINALYSIS, ROUTINE W REFLEX MICROSCOPIC
Bilirubin Urine: NEGATIVE
Glucose, UA: NEGATIVE mg/dL
Ketones, ur: NEGATIVE mg/dL
Nitrite: NEGATIVE
Protein, ur: 100 mg/dL — AB
Specific Gravity, Urine: 1.023 (ref 1.005–1.030)
WBC, UA: >50 WBC/hpf — ABNORMAL HIGH (ref 0–5)
pH: 5 (ref 5.0–8.0)

## 2021-10-20 LAB — LACTIC ACID, PLASMA: Lactic Acid, Venous: 1.8 mmol/L (ref 0.5–1.9)

## 2021-10-20 LAB — LIPASE, BLOOD: Lipase: 22 U/L (ref 11–51)

## 2021-10-20 MED ORDER — LEVOFLOXACIN 500 MG PO TABS: 500.0000 mg | ORAL_TABLET | Freq: Every day | ORAL | 0 refills | Status: DC

## 2021-10-20 MED ORDER — LEVOFLOXACIN 500 MG PO TABS
500.0000 mg | ORAL_TABLET | Freq: Once | ORAL | Status: AC
  Administered 2021-10-20: 500 mg via ORAL
  Filled 2021-10-20: qty 1

## 2021-10-20 MED ORDER — IOHEXOL 300 MG/ML  SOLN
100.0000 mL | Freq: Once | INTRAMUSCULAR | Status: AC | PRN
  Administered 2021-10-20: 100 mL via INTRAVENOUS

## 2021-10-20 MED ORDER — LEVOFLOXACIN 500 MG PO TABS: 500.0000 mg | ORAL_TABLET | Freq: Every day | ORAL | 0 refills | Status: AC

## 2021-10-20 MED ORDER — SODIUM CHLORIDE 0.9 % IV BOLUS
500.0000 mL | Freq: Once | INTRAVENOUS | Status: AC
  Administered 2021-10-20: 500 mL via INTRAVENOUS

## 2021-10-20 NOTE — ED Notes (Signed)
Patient transported to CT 

## 2021-10-20 NOTE — ED Triage Notes (Addendum)
Patient via EMS for left testicle pain that started Monday evening.Ryan Lowe states that he only has pain when testicle is moved or touched.

## 2021-10-20 NOTE — ED Notes (Signed)
US at bedside

## 2021-10-20 NOTE — ED Provider Notes (Signed)
April with [place] Provider Note   CSN: [id number] Arrival date & time: 10/20/21  1419     History  Chief Complaint  Patient presents with   Testicle Pain    Ryan Lowe is a 66 y.o. male.  HPI  Patient with medical history including status post right colectomy due to perforated ascending diverticulitis 11/2520, obesity, A-fib currently not on anticoag, dyspnea presents with chief complaint of left-sided testicular pain.  Patient states pain started suddenly on Monday, states it is his left testicle,  worsened with movements and/or pressure to the area, improves with rest, he endorses some urinary difficulty but denies dysuria penile discharge, he also states having some stomach pain with nausea and vomiting and a slight fever, he states he does not remember the last time he had a bowel movement or pass gas, has no history of testicular cancer or torsion, no history of hernias, he denies any relieving or aggravating factors.  He does note that Monday prior to him having testicular pain he was lifting the anchor did not have any pain after that time.  Reviewed patient's chart back in last year patient had a complicated diverticulitis resulting perforation bowel resection, that is only significant abdominal history.  Home Medications Prior to Admission medications   Medication Sig Start Date End Date Taking? Authorizing Provider  levofloxacin (LEVAQUIN) 500 MG tablet Take 1 tablet (500 mg total) by mouth daily for 9 days. 10/20/21 10/29/21 Yes Marcello Fennel, PA-C  aspirin 325 MG tablet Take 500 mg by mouth daily.     [provider]  cetirizine (ZYRTEC) 10 MG tablet Take 10 mg by mouth daily.      [provider]  diltiazem (CARDIZEM CD) 300 MG 24 hr capsule Take 300 mg by mouth daily. 10/11/21   [provider]  diltiazem (TAZTIA XT) 360 MG 24 hr capsule Take 1 capsule (360 mg total) by mouth daily. 05/31/13   Lendon Colonel, NP   fish oil-omega-3 fatty acids 1000 MG capsule Take 1 capsule by mouth daily.      [provider]  glipiZIDE (GLUCOTROL) 5 MG tablet Take 5 mg by mouth daily. 10/11/21   [provider]  Multiple Vitamins-Minerals (ONE-A-DAY WEIGHT SMART ADVANCE) TABS Take 1 tablet by mouth daily.      [provider]      Allergies    Patient has no known allergies.    Review of Systems   Review of Systems  Constitutional:  Negative for chills and fever.  Respiratory:  Negative for shortness of breath.   Cardiovascular:  Negative for chest pain.  Gastrointestinal:  Positive for nausea and vomiting. Negative for abdominal pain and constipation.  Genitourinary:  Positive for difficulty urinating, scrotal swelling and testicular pain. Negative for flank pain and frequency.  Neurological:  Negative for headaches.   Physical Exam Updated Vital Signs BP (!) 140/95    Pulse 100    Temp 99.2 F (37.3 C) (Oral)    Resp 20    Ht _0  (1.956 m)    Wt (!) 145.2 kg    SpO2 96%    BMI 37.95 kg/m  Physical Exam Vitals and nursing note reviewed. Exam conducted with a chaperone present.  Constitutional:      General: He is not in acute distress.    Appearance: He is not ill-appearing.  HENT:     Head: Normocephalic and atraumatic.     Nose: No congestion.  Eyes:     Conjunctiva/sclera: Conjunctivae normal.  Cardiovascular:     Rate and Rhythm: Normal rate and regular rhythm.     Pulses: Normal pulses.     Heart sounds: No murmur heard.   No friction rub. No gallop.  Pulmonary:     Effort: No respiratory distress.     Breath sounds: No wheezing, rhonchi or rales.  Abdominal:     General: There is no distension.     Palpations: Abdomen is soft.     Tenderness: There is abdominal tenderness. There is no right CVA tenderness or left CVA tenderness.     Comments: Abdomen nondistended, normal bowel sounds, dull to percussion, he has noted suprapubic/lower abdominal tenderness, there  is no guarding, rebound test, peritoneal sign, negative Murphy sign McBurney point, no CVA tenderness.  Genitourinary:    Comments: With chaperone present genital exam was performed, no rash present on external genitalia, does have an erythematous and swollen left scrotum, there is no penile discharge present, he has no cremaster reflexes, right testicle was palpated without any gross abnormalities, left is enlarged, unclear if this is bowel versus enlarged testicle, it is firm, extremely tender to palpation does not improve with elevation. Musculoskeletal:     Right lower leg: No edema.     Left lower leg: No edema.  Skin:    General: Skin is warm and dry.  Neurological:     Mental Status: He is alert.  Psychiatric:        Mood and Affect: Mood normal.    ED Results / Procedures / Treatments   Labs (all labs ordered are listed, but only abnormal results are displayed) Labs Reviewed  COMPREHENSIVE METABOLIC PANEL - Abnormal; Notable for the following components:      Result Value   Sodium 134 (*)    Glucose, Bld 153 (*)    Albumin 3.4 (*)    Total Bilirubin 3.7 (*)    All other components within normal limits  CBC WITH DIFFERENTIAL/PLATELET - Abnormal; Notable for the following components:   WBC 20.2 (*)    Neutro Abs 14.9 (*)    Monocytes Absolute 1.2 (*)    All other components within normal limits  URINALYSIS, ROUTINE W REFLEX MICROSCOPIC - Abnormal; Notable for the following components:   APPearance TURBID (*)    Hgb urine dipstick SMALL (*)    Protein, ur 100 (*)    Leukocytes,Ua LARGE (*)    WBC, UA >50 (*)    Bacteria, UA RARE (*)    All other components within normal limits  URINE CULTURE  LIPASE, BLOOD  LACTIC ACID, PLASMA  GC/CHLAMYDIA PROBE AMP ([place]) NOT AT [facility]    EKG None  Radiology CT Abdomen Pelvis W Contrast  Result Date: 10/20/2021 CLINICAL DATA:  Biliary obstruction suspected (Ped 0-17y). Left testicular pain that started Monday with  abnormal labs. History of colectomy, partial move of the terminal ileum, and ileocolostomy on 12/21/2020. History of exploratory laparotomy on 12/27/2020 EXAM: CT ABDOMEN AND PELVIS WITH CONTRAST TECHNIQUE: Multidetector CT imaging of the abdomen and pelvis was performed using the standard protocol following bolus administration of intravenous contrast. RADIATION DOSE REDUCTION: This exam was performed according to the departmental dose-optimization program which includes automated exposure control, adjustment of the mA and/or kV according to patient size and/or use of iterative reconstruction technique. CONTRAST:  132m OMNIPAQUE IOHEXOL 300 MG/ML  SOLN COMPARISON:  CT abdomen pelvis 01/08/2021 FINDINGS: Lower chest: No acute abnormality. Enlarged heart. Coronary  artery calcification. Hepatobiliary: Perihepatic free fluid and fat stranding along the right inferior hepatic lobe in the setting of findings suggestive of surgical material. Subcentimeter hypodensities too small to characterize. Otherwise no focal liver abnormality. No gallstones, gallbladder wall thickening, or pericholecystic fluid. No biliary dilatation. Pancreas: No focal lesion. Normal pancreatic contour. No surrounding inflammatory changes. No main pancreatic ductal dilatation. Spleen: Normal in size without focal abnormality. Adrenals/Urinary Tract: No adrenal nodule bilaterally. A or shoe kidney is noted. Bilateral kidneys enhance symmetrically. Subcentimeter hypodensities are too small to characterize. No hydronephrosis. No hydroureter. Circumferential urinary bladder wall thickening with mild perivesicular fat stranding. Stomach/Bowel: Partial colectomy and small bowel resection noted. Stomach is within normal limits. No evidence of bowel wall thickening or dilatation. Vascular/Lymphatic: No abdominal aorta or iliac aneurysm. Moderate to severe atherosclerotic plaque of the aorta and its branches. No abdominal, pelvic, or inguinal  lymphadenopathy. Reproductive: Prostate is unremarkable. Trace fluid noted within the left inguinal canal with query trace bilateral hydroceles. Slight fat stranding within the left inguinal canal. Other: Trace right upper quadrant free fluid. No intraperitoneal free gas. No organized fluid collection. Anterior abdomen calcification likely postsurgical changes. Musculoskeletal: No abdominal wall hernia or abnormality. No suspicious lytic or blastic osseous lesions. No acute displaced fracture. Multilevel degenerative changes of the spine. IMPRESSION: 1. Trace simple free fluid within the right upper abdomen along the right inferior hepatic lobe and gallbladder. Associated hydropic gallbladder. No definite gallbladder wall thickening. No findings of biliary dilatation. Consider correlation with a right upper quadrant ultrasound for further evaluation. 2. Circumferential urinary bladder wall thickening with mild perivesicular fat stranding. Correlate with urinalysis for infection. 3. Left inguinal canal fat stranding and trace free fluid. Please see separately dictated ultrasound scrotum 10/20/2021 for further details. 4. Bilateral hydroceles. 5. Incidentally noted horseshoe kidney. 6.  Aortic Atherosclerosis (ICD10-I70.0). Electronically Signed   By: Iven Finn M.D.   On: 10/20/2021 17:16   US SCROTUM W/DOPPLER  Result Date: 10/20/2021 CLINICAL DATA:  Swollen left testicle EXAM: SCROTAL ULTRASOUND DOPPLER ULTRASOUND OF THE TESTICLES TECHNIQUE: Complete ultrasound examination of the testicles, epididymis, and other scrotal structures was performed. Color and spectral Doppler ultrasound were also utilized to evaluate blood flow to the testicles. COMPARISON:  None. FINDINGS: Right testicle Measurements: 5.6 x 2.6 x 3.4 cm. No mass or microlithiasis visualized. Left testicle Measurements: 5.6 x 4.1 x 3.8 cm. No mass or microlithiasis visualized. Right epididymis:  Normal in size and appearance. Left epididymis:  Enlargement of left epididymis which is hypervascular. Mild complex fluid surrounding the epididymis. Hydrocele:  Complex left hydrocele.  Minimal right hydrocele. Varicocele:  None visualized. Pulsed Doppler interrogation of both testes demonstrates normal low resistance arterial and venous waveforms bilaterally. IMPRESSION: Normal testes Enlargement of the left but did epididymis with hypervascularity and complex surrounding fluid. Findings most compatible with left epididymitis. Electronically Signed   By: Franchot Gallo M.D.   On: 10/20/2021 16:10    Procedures Procedures    Medications Ordered in ED Medications  levofloxacin (LEVAQUIN) tablet 500 mg (has no administration in time range)  sodium chloride 0.9 % bolus 500 mL (0 mLs Intravenous Stopped 10/20/21 1636)  iohexol (OMNIPAQUE) 300 MG/ML solution 100 mL (100 mLs Intravenous Contrast Given 10/20/21 1647)    ED Course/ Medical Decision Making/ A&P                           Medical Decision Making Amount and/or Complexity of Data Reviewed Labs: ordered.  Radiology: ordered. ECG/medicine tests: ordered.  Risk Prescription drug management.   This patient presents to the ED for concern of testicular pain, this involves an extensive number of treatment options, and is a complaint that carries with it a high risk of complications and morbidity.  The differential diagnosis includes testicular torsion, epididymitis, inguinal hernia, incarcerated hernia    Additional history obtained:  Additional history obtained from electronic medical record External records from outside source obtained and reviewed including please see HPI for further detail   Co morbidities that complicate the patient evaluation  Right colectomy  Social Determinants of Health:  N/A    Lab Tests:  I Ordered, and personally interpreted labs.  The pertinent results include: CBC shows leukocytosis of 20.2, CMP shows sodium 134 glucose 153 albumin 3.4  albumin 3.7, lipase 22, UA shows large leukocytes red blood cells many white blood cells rare bacteria white clumps present.  Lactic 1.8.  GC chlamydia and urine cultures pending at this time.   Imaging Studies ordered:  I ordered imaging studies including scrotal ultrasound CT abdomen pelvis I independently visualized and interpreted imaging which showed reveals patient has enlarged left epididymis with hypervascularity surrounding fluids.  Compatible with left epididymitis.  CT reveals free fluid in around the hepatic lobe no gallbladder wall thickening no biliary dilation.  Shows evidence of bladder wall thickening, left inguinal central canal fat stranding with free fluids I agree with the radiologist interpretation   Cardiac Monitoring:  The patient was maintained on a cardiac monitor.  I personally viewed and interpreted the cardiac monitored which showed an underlying rhythm of: N/A   Medicines ordered and prescription drug management:  I ordered medication including fluids for dehydration I have reviewed the patients home medicines and have made adjustments as needed    Reevaluation:   On arrival patient was uncomfortable, has enlarged left scrotum, I am concerned for possible testicular torsion versus inguinal hernia, will obtain ultrasound for further evaluation, I offered pain medication but patient declined.  Patient has epididymitis, he was endorsing decrease in bowel movements as well as no flatus he has a history of right colectomy due to diverticulitis I am concerned for possible bowel obstruction, will obtain CT imaging for further evaluation.  CT imaging is negative for acute findings, does show that there is possible fluid around liver, I reassessed his abdomen he has no tenderness in his right upper quadrant he has elevation liver enzymes or alk phos no gallbladder thickening, no ductal dilation, I have lower suspicion for acute cholecystitis or cholangitis at this  time.  Patient is agreed for discharge at this time.  Rule out Low suspicion for systemic infection as patient nontoxic-appearing, afebrile, vital signs have improved, he has no elevation lactic.  He does have an elevated white count but this likely secondary due to epididymitis as well as probable urinary tract infection.  I have low suspicion for testicular torsion as imaging is negative for these findings.  Low suspicion for bowel obstruction, intra-abdominal infection, volvulus, diverticulitis, Pilo and/or kidney kidney stones as CT imaging is negative for these findings.  I have low suspicion for dissection as presentation 80 of etiology.    Dispostion and problem list  After consideration of the diagnostic results and the patients response to treatment, I feel that the patent would benefit from discharge.  Epididymitis-due to his age we will start him on Levaquin, this will also cover for possible UTI as well, will have him follow-up with urology for further  evaluation.  Gave strict return precautions. Elevated liver enzymes-unclear etiology we will have him follow-up with PCP for reevaluation.            Final Clinical Impression(s) / ED Diagnoses Final diagnoses:  Epididymitis    Rx / DC Orders ED Discharge Orders          Ordered    levofloxacin (LEVAQUIN) 500 MG tablet  Daily        10/20/21 1753              Marcello Fennel, PA-C 10/20/21 1755    Godfrey Pick, MD 10/21/21 [id number]

## 2021-10-20 NOTE — Discharge Instructions (Signed)
Left testicle pain-shows that you have epididymitis which infection of your testicle, I have started you on antibiotics please take as prescribed.  Given you your first dose today please start your antibiotics tomorrow.  Recommend over-the-counter pain medication as needed.  It is noted that this medication can  decrease your blood sugar please check it frequently.  Please follow-up with your urologist in weeks time for reevaluation. Elevated T. Bili-this is likely a benign finding, please follow-up with your PCP for further evaluation of this.  Come back to the emergency department if you develop chest pain, shortness of breath, severe abdominal pain, uncontrolled nausea, vomiting, diarrhea.

## 2021-10-21 LAB — GC/CHLAMYDIA PROBE AMP (~~LOC~~) NOT AT ARMC
Chlamydia: NEGATIVE
Comment: NEGATIVE
Comment: NORMAL
Neisseria Gonorrhea: NEGATIVE

## 2021-10-23 LAB — URINE CULTURE: Culture: 70000 — AB

## 2021-10-24 ENCOUNTER — Telehealth: Payer: Self-pay | Admitting: Emergency Medicine

## 2021-10-24 NOTE — Telephone Encounter (Signed)
Post ED Visit - Positive Culture Follow-up  Culture report reviewed by antimicrobial stewardship pharmacist: [place] Team []  Elenor Quinones, Pharm.D. []  Heide Guile, Pharm.D., BCPS AQ-ID []  Parks Neptune, Pharm.D., BCPS []  Alycia Rossetti, Pharm.D., BCPS []  [place], [place].D., BCPS, AAHIVP []  Legrand Como, Pharm.D., BCPS, AAHIVP []  Salome Arnt, PharmD, BCPS []  Johnnette Gourd, PharmD, BCPS []  Hughes Better, PharmD, BCPS [x]  Lorelei Pont, PharmD []  Laqueta Linden, PharmD, BCPS []  Albertina Parr, PharmD  [place] Team []  Leodis Sias, PharmD []  Lindell Spar, PharmD []  Royetta Asal, PharmD []  Graylin Shiver, Rph []  Rema Fendt) Glennon Mac, PharmD []  Arlyn Dunning, PharmD []  Netta Cedars, PharmD []  Dia Sitter, PharmD []  Leone Haven, PharmD []  Gretta Arab, PharmD []  Theodis Shove, PharmD []  Peggyann Juba, PharmD []  Reuel Boom, PharmD   Positive  urine culture Treated with Levofloxacin, organism sensitive to the same and no further patient follow-up is required at this time.  Sandi Raveling Mirko Tailor 10/24/2021, 6:20 PM

## 2021-11-01 ENCOUNTER — Other Ambulatory Visit: Payer: Self-pay

## 2021-11-01 ENCOUNTER — Ambulatory Visit: Payer: Medicare Other | Admitting: Urology

## 2021-11-01 ENCOUNTER — Encounter: Payer: Self-pay | Admitting: Urology

## 2021-11-01 VITALS — BP 103/67 | HR 89 | Ht 77.0 in | Wt 320.0 lb

## 2021-11-01 DIAGNOSIS — R3912 Poor urinary stream: Secondary | ICD-10-CM | POA: Diagnosis not present

## 2021-11-01 DIAGNOSIS — N401 Enlarged prostate with lower urinary tract symptoms: Secondary | ICD-10-CM | POA: Diagnosis not present

## 2021-11-01 DIAGNOSIS — N451 Epididymitis: Secondary | ICD-10-CM | POA: Diagnosis not present

## 2021-11-01 MED ORDER — CEFDINIR 300 MG PO CAPS
300.0000 mg | ORAL_CAPSULE | Freq: Two times a day (BID) | ORAL | 0 refills | Status: DC
Start: 2021-11-01 — End: 2021-11-15

## 2021-11-01 NOTE — Progress Notes (Signed)
? ?11/01/2021 ?10:15 AM  ? ?Ryan Lowe ?01/16/1956 ?[id number] ? ?Referring provider: Vyas, Dhruv B, MD ?[address] ?[place],  [place] [address] ? ?No chief complaint on file. ? ? ?HPI: ? ?New pt -  ? ?1) left epididymitis-he developed left scrotal pain and swelling.  He went to emergency 10/20/2021.  He had fever to 102. Scrotal ultrasound revealed an enlarged left epididymis with some surrounding edema consistent with epididymitis.  Testicles appeared normal without mass.  He also underwent CT scan of the abdomen and pelvis which was benign.  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  White count was 20, creatinine 1.  UA consistent with UTI and culture grew Serratia.  GC and Chlamydia were negative. ? ?He just completed Levaquin, Still some swelling and pain. No fever.  ? ?2) BPH - prostate about 30 g on CT 02/23 done for left E-O. No GU meds or surgery. H/o right colectomy April 2022 with prolonged retention after surgery for several weeks. He could not tolerate alpha blockers - tried alfuzosin and tamsulosin. He declined cystoscopy. PVRs ranged 161-400.  ? ?PVR 139 ml - he could not void prior. Reports he's voiding with an adequate flow.  ? ?Seen in [place] at [facility].  ? ?PMH: ?Past Medical History:  ?Diagnosis Date  ? Atrial fibrillation (HCC)   ? Diabetes mellitus without complication (HCC)   ? ? ?Surgical History: ?No past surgical history on file. ? ?Home Medications:  ?Allergies as of 11/01/2021   ? ?   Reactions  ? Tamsulosin Other (See Comments)  ? Abdominal bloating, nausea, fatigue, dysnea  ? ?  ? ?  ?Medication List  ?  ? ?  ? Accurate as of November 01, 2021 10:15 AM. If you have any questions, ask your nurse or doctor.  ?  ?  ? ?  ? ?aspirin 325 MG tablet ?Take 500 mg by mouth daily. ?  ?cetirizine 10 MG tablet ?Commonly known as: ZYRTEC ?Take 10 mg by mouth daily. ?  ?diltiazem 300 MG 24 hr capsule ?Commonly known as: CARDIZEM CD ?Take 300 mg by mouth daily. ?  ?diltiazem 360 MG 24 hr capsule ?Commonly known  as: Taztia XT ?Take 1 capsule (360 mg total) by mouth daily. ?  ?fish oil-omega-3 fatty acids 1000 MG capsule ?Take 1 capsule by mouth daily. ?  ?glipiZIDE 5 MG tablet ?Commonly known as: GLUCOTROL ?Take 5 mg by mouth daily. ?  ?One-A-Day Weight Smart Advance Tabs ?Take 1 tablet by mouth daily. ?  ? ?  ? ? ?Allergies:  ?Allergies  ?Allergen Reactions  ? Tamsulosin Other (See Comments)  ?  Abdominal bloating, nausea, fatigue, dysnea  ? ? ?Family History: ?Family History  ?Problem Relation Age of Onset  ? Heart disease Other   ? Heart failure Other   ? ? ?Social History:  reports that he quit smoking about 30 years ago. He does not have any smokeless tobacco history on file. He reports that he does not drink alcohol and does not use drugs. ? ? ?Physical Exam: ?BP 103/67   Pulse 89   Ht 6' 5" (1.956 m)   Wt (!) 320 lb (145.2 kg)   BMI 37.95 kg/m?   ?Constitutional:  Alert and oriented, No acute distress. ?HEENT: [place] AT, moist mucus membranes.  Trachea midline, no masses. ?Cardiovascular: No clubbing, cyanosis, or edema. ?Respiratory: Normal respiratory effort, no increased work of breathing. ?GI: Abdomen is soft, nontender, nondistended, no abdominal masses ?GU: No   CVA tenderness ?Lymph: No cervical or inguinal lymphadenopathy. ?Skin: No rashes, bruises or suspicious lesions. ?Neurologic: Grossly intact, no focal deficits, moving all 4 extremities. ?Psychiatric: Normal mood and affect. ?GU: Swelling and tenderness of the left epididymis and scrotum c/w left E-O. No necrosis or fluctuance.  ? ?Laboratory Data: ?Lab Results  ?Component Value Date  ? WBC 20.2 (H) 10/20/2021  ? HGB 13.7 10/20/2021  ? HCT 42.2 10/20/2021  ? MCV 92.1 10/20/2021  ? PLT 195 10/20/2021  ? ? ?Lab Results  ?Component Value Date  ? CREATININE 1.00 10/20/2021  ? ? ?No results found for: PSA ? ?No results found for: TESTOSTERONE ? ?No results found for: HGBA1C ? ?Urinalysis ?   ?Component Value Date/Time  ? COLORURINE YELLOW 10/20/2021 1616  ?  APPEARANCEUR TURBID (A) 10/20/2021 1616  ? LABSPEC 1.023 10/20/2021 1616  ? PHURINE 5.0 10/20/2021 1616  ? GLUCOSEU NEGATIVE 10/20/2021 1616  ? HGBUR SMALL (A) 10/20/2021 1616  ? BILIRUBINUR NEGATIVE 10/20/2021 1616  ? KETONESUR NEGATIVE 10/20/2021 1616  ? PROTEINUR 100 (A) 10/20/2021 1616  ? NITRITE NEGATIVE 10/20/2021 1616  ? LEUKOCYTESUR LARGE (A) 10/20/2021 1616  ? ? ?Lab Results  ?Component Value Date  ? BACTERIA RARE (A) 10/20/2021  ? ? ?Pertinent Imaging: ?[place] and CT scan  ? ? ?Assessment & Plan:   ? ?1. Benign localized prostatic hyperplasia with lower urinary tract symptoms (LUTS) ?Emptying OK.  ?- Urinalysis, Routine w reflex microscopic ?- BLADDER SCAN AMB NON-IMAGING ? ?2. Weak urinary stream ?Did not tolerate alpha blockers.  ?- Urinalysis, Routine w reflex microscopic ?- BLADDER SCAN AMB NON-IMAGING ? ?3. Epididymitis -  ?Continue abx x 10 more days. I'll send cefdinir. Pain has not taking anything for pain - says it doesn't hurt enough but I recommended he alternate ibuprofen and acetaminophen q 4 hours.  ? ? ?No follow-ups on file. ? ?Jerilee Field, MD ? ?[facility] [place]  ?[address] ?[place], [place] [address] ?[phone] ? ? ?

## 2021-11-01 NOTE — Progress Notes (Signed)
post void residual =159 

## 2021-11-01 NOTE — H&P (View-Only) (Signed)
? ?11/01/2021 ?10:15 AM  ? ?Ryan Lowe ?[birth year] ?[id number] ? ?Referring provider: Ignatius Specking, MD ?[address] ?[place],  [place] [address] ? ?No chief complaint on file. ? ? ?HPI: ? ?New pt -  ? ?1) left epididymitis-he developed left scrotal pain and swelling.  He went to emergency 10/20/2021.  He had fever to 102. Scrotal ultrasound revealed an enlarged left epididymis with some surrounding edema consistent with epididymitis.  Testicles appeared normal without mass.  He also underwent CT scan of the abdomen and pelvis which was benign.  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  White count was 20, creatinine 1.  UA consistent with UTI and culture grew Serratia.  GC and Chlamydia were negative. ? ?He just completed Levaquin, Still some swelling and pain. No fever.  ? ?2) BPH - prostate about 30 g on CT 02/23 done for left E-O. No GU meds or surgery. H/o right colectomy April 2022 with prolonged retention after surgery for several weeks. He could not tolerate alpha blockers - tried alfuzosin and tamsulosin. He declined cystoscopy. PVRs ranged 161-400.  ? ?PVR 139 ml - he could not void prior. Reports he's voiding with an adequate flow.  ? ?Seen in [place] at [facility].  ? ?PMH: ?Past Medical History:  ?Diagnosis Date  ? Atrial fibrillation (HCC)   ? Diabetes mellitus without complication (HCC)   ? ? ?Surgical History: ?No past surgical history on file. ? ?Home Medications:  ?Allergies as of 11/01/2021   ? ?   Reactions  ? Tamsulosin Other (See Comments)  ? Abdominal bloating, nausea, fatigue, dysnea  ? ?  ? ?  ?Medication List  ?  ? ?  ? Accurate as of November 01, 2021 10:15 AM. If you have any questions, ask your nurse or doctor.  ?  ?  ? ?  ? ?aspirin 325 MG tablet ?Take 500 mg by mouth daily. ?  ?cetirizine 10 MG tablet ?Commonly known as: ZYRTEC ?Take 10 mg by mouth daily. ?  ?diltiazem 300 MG 24 hr capsule ?Commonly known as: CARDIZEM CD ?Take 300 mg by mouth daily. ?  ?diltiazem 360 MG 24 hr capsule ?Commonly known  as: Taztia XT ?Take 1 capsule (360 mg total) by mouth daily. ?  ?fish oil-omega-3 fatty acids 1000 MG capsule ?Take 1 capsule by mouth daily. ?  ?glipiZIDE 5 MG tablet ?Commonly known as: GLUCOTROL ?Take 5 mg by mouth daily. ?  ?One-A-Day Weight Smart Advance Tabs ?Take 1 tablet by mouth daily. ?  ? ?  ? ? ?Allergies:  ?Allergies  ?Allergen Reactions  ? Tamsulosin Other (See Comments)  ?  Abdominal bloating, nausea, fatigue, dysnea  ? ? ?Family History: ?Family History  ?Problem Relation Age of Onset  ? Heart disease Other   ? Heart failure Other   ? ? ?Social History:  reports that he quit smoking about 30 years ago. He does not have any smokeless tobacco history on file. He reports that he does not drink alcohol and does not use drugs. ? ? ?Physical Exam: ?BP 103/67   Pulse 89   Ht 6\' 5"  (1.956 m)   Wt (!) 320 lb (145.2 kg)   BMI 37.95 kg/m?   ?Constitutional:  Alert and oriented, No acute distress. ?HEENT: [place] AT, moist mucus membranes.  Trachea midline, no masses. ?Cardiovascular: No clubbing, cyanosis, or edema. ?Respiratory: Normal respiratory effort, no increased work of breathing. ?GI: Abdomen is soft, nontender, nondistended, no abdominal masses ?GU: No  CVA tenderness ?Lymph: No cervical or inguinal lymphadenopathy. ?Skin: No rashes, bruises or suspicious lesions. ?Neurologic: Grossly intact, no focal deficits, moving all 4 extremities. ?Psychiatric: Normal mood and affect. ?GU: Swelling and tenderness of the left epididymis and scrotum c/w left E-O. No necrosis or fluctuance.  ? ?Laboratory Data: ?Lab Results  ?Component Value Date  ? WBC 20.2 (H) 10/20/2021  ? HGB 13.7 10/20/2021  ? HCT 42.2 10/20/2021  ? MCV 92.1 10/20/2021  ? PLT 195 10/20/2021  ? ? ?Lab Results  ?Component Value Date  ? CREATININE 1.00 10/20/2021  ? ? ?No results found for: PSA ? ?No results found for: TESTOSTERONE ? ?No results found for: HGBA1C ? ?Urinalysis ?   ?Component Value Date/Time  ? COLORURINE YELLOW 10/20/2021 1616  ?  APPEARANCEUR TURBID (A) 10/20/2021 1616  ? LABSPEC 1.023 10/20/2021 1616  ? PHURINE 5.0 10/20/2021 1616  ? GLUCOSEU NEGATIVE 10/20/2021 1616  ? HGBUR SMALL (A) 10/20/2021 1616  ? BILIRUBINUR NEGATIVE 10/20/2021 1616  ? KETONESUR NEGATIVE 10/20/2021 1616  ? PROTEINUR 100 (A) 10/20/2021 1616  ? NITRITE NEGATIVE 10/20/2021 1616  ? LEUKOCYTESUR LARGE (A) 10/20/2021 1616  ? ? ?Lab Results  ?Component Value Date  ? BACTERIA RARE (A) 10/20/2021  ? ? ?Pertinent Imaging: ?[place] and CT scan  ? ? ?Assessment & Plan:   ? ?1. Benign localized prostatic hyperplasia with lower urinary tract symptoms (LUTS) ?Emptying OK.  ?- Urinalysis, Routine w reflex microscopic ?- BLADDER SCAN AMB NON-IMAGING ? ?2. Weak urinary stream ?Did not tolerate alpha blockers.  ?- Urinalysis, Routine w reflex microscopic ?- BLADDER SCAN AMB NON-IMAGING ? ?3. Epididymitis -  ?Continue abx x 10 more days. I'll send cefdinir. Pain has not taking anything for pain - says it doesn't hurt enough but I recommended he alternate ibuprofen and acetaminophen q 4 hours.  ? ? ?No follow-ups on file. ? ?Jerilee Field, MD ? ?[facility] [place]  ?[address] ?[place], [place] [address] ?[phone] ? ? ?

## 2021-11-12 DIAGNOSIS — R350 Frequency of micturition: Secondary | ICD-10-CM | POA: Diagnosis not present

## 2021-11-12 DIAGNOSIS — N453 Epididymo-orchitis: Secondary | ICD-10-CM | POA: Diagnosis not present

## 2021-11-15 ENCOUNTER — Encounter: Payer: Self-pay | Admitting: Urology

## 2021-11-15 ENCOUNTER — Other Ambulatory Visit: Payer: Self-pay

## 2021-11-15 ENCOUNTER — Ambulatory Visit: Payer: Medicare Other | Admitting: Urology

## 2021-11-15 VITALS — BP 112/82 | HR 96

## 2021-11-15 DIAGNOSIS — N401 Enlarged prostate with lower urinary tract symptoms: Secondary | ICD-10-CM

## 2021-11-15 DIAGNOSIS — N454 Abscess of epididymis or testis: Secondary | ICD-10-CM

## 2021-11-15 LAB — URINALYSIS, ROUTINE W REFLEX MICROSCOPIC
Bilirubin, UA: NEGATIVE
Glucose, UA: NEGATIVE
Ketones, UA: NEGATIVE
Leukocytes,UA: NEGATIVE
Nitrite, UA: NEGATIVE
Protein,UA: NEGATIVE
RBC, UA: NEGATIVE
Specific Gravity, UA: 1.025 (ref 1.005–1.030)
Urobilinogen, Ur: 0.2 mg/dL (ref 0.2–1.0)
pH, UA: 6 (ref 5.0–7.5)

## 2021-11-15 LAB — BLADDER SCAN AMB NON-IMAGING: Scan Result: 124

## 2021-11-15 NOTE — Progress Notes (Signed)
I spoke with Ryan Lowe. We have discussed possible surgery dates and 11/19/2021 was agreed upon by all parties. Patient given information about surgery date, what to expect pre-operatively and post operatively.  ?  ?We discussed that a pre-op nurse will be calling to set up the pre-op visit that will take place prior to surgery. Informed patient that our office will communicate any additional care to be provided after surgery.  ?  ?Patients questions or concerns were discussed during our call. Advised to call our office should there be any additional information, questions or concerns that arise. Patient verbalized understanding.   ?

## 2021-11-15 NOTE — Progress Notes (Signed)
I spoke with Ryan Lowe. We have discussed possible surgery dates and 11/19/2021 was agreed upon by all parties. Patient given information about surgery date, what to expect pre-operatively and post operatively.  ?  ?We discussed that a pre-op nurse will be calling to set up the pre-op visit that will take place prior to surgery. Informed patient that our office will communicate any additional care to be provided after surgery.  ?  ?Patients questions or concerns were discussed during our call. Advised to call our office should there be any additional information, questions or concerns that arise. Patient verbalized understanding.   ?

## 2021-11-15 NOTE — Progress Notes (Signed)
? ?11/15/2021 ?11:16 AM  ? ?Ryan Lowe ?1955/11/29 ?[id number] ? ?Referring provider: Ignatius Specking, MD ?[address] ?[place],  [place] [address] ? ?No chief complaint on file. ? ? ?HPI: ? ?1) left epididymitis-he developed left scrotal pain and swelling.  He went to emergency 10/20/2021 with fever to 102. Scrotal ultrasound revealed an enlarged left epididymis with some surrounding edema consistent with epididymitis.  Testicles appeared normal without mass.  He also underwent CT scan of the abdomen and pelvis which was benign.  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  White count was 20, creatinine 1.  UA consistent with UTI and culture grew Serratia.  GC and Chlamydia were negative. He completed Levaquin (10days) and I continued him on cefdinir (10 days) due to continued left scrotal swelling 11/01/2021.  He was then seen 11/12/2021 at [facility] when the left scrotal swelling came to ahead and spontaneously ruptured.  The swelling has decreased, but the area of rupture is slowly getting larger.  No fever.  He was given Rocephin and continued on Bactrim. His urine has cleared up. He is feeling better. They've been using hot compresses.  ?  ?2) BPH - prostate about 30 g on CT 02/23 done for left E-O. No GU meds or surgery. H/o right colectomy April 2022 with prolonged retention after surgery for several weeks. He could not tolerate alpha blockers - tried alfuzosin and tamsulosin. He declined cystoscopy. PVRs ranged 161-400.  ?  ?PVR 139 ml - he could not void prior. Reports he's voiding with an adequate flow with PVR 124 ml.  ? ?Seen in [place] at [facility] - notes reviewed.   ? ? ?PMH: ?Past Medical History:  ?Diagnosis Date  ? Atrial fibrillation (HCC)   ? Diabetes mellitus without complication (HCC)   ? ? ?Surgical History: ?No past surgical history on file. ? ?Home Medications:  ?Allergies as of 11/15/2021   ? ?   Reactions  ? Tamsulosin Other (See Comments)  ? Abdominal bloating, nausea, fatigue, dysnea  ? ?  ? ?   ?Medication List  ?  ? ?  ? Accurate as of November 15, 2021 11:16 AM. If you have any questions, ask your nurse or doctor.  ?  ?  ? ?  ? ?STOP taking these medications   ? ?cefdinir 300 MG capsule ?Commonly known as: OMNICEF ?Stopped by: Jerilee Field, MD ?  ? ?  ? ?TAKE these medications   ? ?aspirin 325 MG tablet ?Take 500 mg by mouth daily. ?  ?cetirizine 10 MG tablet ?Commonly known as: ZYRTEC ?Take 10 mg by mouth daily. ?  ?diltiazem 300 MG 24 hr capsule ?Commonly known as: CARDIZEM CD ?Take 300 mg by mouth daily. ?  ?diltiazem 360 MG 24 hr capsule ?Commonly known as: Taztia XT ?Take 1 capsule (360 mg total) by mouth daily. ?  ?fish oil-omega-3 fatty acids 1000 MG capsule ?Take 1 capsule by mouth daily. ?  ?glipiZIDE 5 MG tablet ?Commonly known as: GLUCOTROL ?Take 5 mg by mouth daily. ?  ?One-A-Day Weight Smart Advance Tabs ?Take 1 tablet by mouth daily. ?  ? ?  ? ? ?Allergies:  ?Allergies  ?Allergen Reactions  ? Tamsulosin Other (See Comments)  ?  Abdominal bloating, nausea, fatigue, dysnea  ? ? ?Family History: ?Family History  ?Problem Relation Age of Onset  ? Heart disease Other   ? Heart failure Other   ? ? ?Social History:  reports that he quit smoking about  30 years ago. He does not have any smokeless tobacco history on file. He reports that he does not drink alcohol and does not use drugs. ? ? ?Physical Exam: ?BP 112/82   Pulse 96   ?Constitutional:  Alert and oriented, No acute distress. ?HEENT: [place] AT, moist mucus membranes.  Trachea midline, no masses. ?Cardiovascular: No clubbing, cyanosis, or edema. ?Respiratory: Normal respiratory effort, no increased work of breathing. ?GI: Abdomen is soft, nontender, nondistended, no abdominal masses ?GU: No CVA tenderness; penis, right testicle and scrotum normal.  He continues to have induration of the left testicle epididymis and spermatic cord.  Overall swelling stable to improved.  There is a nickle sized area of necrotic tissue that is draining. Some  fluctuance underneath. ?Lymph: No cervical or inguinal lymphadenopathy. ?Skin: No rashes, bruises or suspicious lesions. ?Neurologic: Grossly intact, no focal deficits, moving all 4 extremities. ?Psychiatric: Normal mood and affect. ? ?Laboratory Data: ?Lab Results  ?Component Value Date  ? WBC 20.2 (H) 10/20/2021  ? HGB 13.7 10/20/2021  ? HCT 42.2 10/20/2021  ? MCV 92.1 10/20/2021  ? PLT 195 10/20/2021  ? ? ?Lab Results  ?Component Value Date  ? CREATININE 1.00 10/20/2021  ? ? ?No results found for: PSA ? ?No results found for: TESTOSTERONE ? ?No results found for: HGBA1C ? ?Urinalysis ?   ?Component Value Date/Time  ? COLORURINE YELLOW 10/20/2021 1616  ? APPEARANCEUR TURBID (A) 10/20/2021 1616  ? LABSPEC 1.023 10/20/2021 1616  ? PHURINE 5.0 10/20/2021 1616  ? GLUCOSEU NEGATIVE 10/20/2021 1616  ? HGBUR SMALL (A) 10/20/2021 1616  ? BILIRUBINUR NEGATIVE 10/20/2021 1616  ? KETONESUR NEGATIVE 10/20/2021 1616  ? PROTEINUR 100 (A) 10/20/2021 1616  ? NITRITE NEGATIVE 10/20/2021 1616  ? LEUKOCYTESUR LARGE (A) 10/20/2021 1616  ? ? ?Lab Results  ?Component Value Date  ? BACTERIA RARE (A) 10/20/2021  ? ? ? ? ? ?Assessment & Plan:   ? ?1. Benign localized prostatic hyperplasia with lower urinary tract symptoms (LUTS) ?Voiding adequately off alpha blockers.  ?- Urinalysis, Routine w reflex microscopic ?- BLADDER SCAN AMB NON-IMAGING ? ?2.  Left epididymoorchitis with abscess-we discussed the need to debride the left scrotum likely left orchiectomy.  Discussed with patient and wife the nature risk benefits and alternatives to the procedure.  He asked about testosterone levels.  We talked about possible decrease in sperm counts and testosterone levels among other risks with orchiectomy.  We discussed he may need testosterone replacement in the future but his right testicle is palpably normal.  He also ask about Viagra and we discussed this is a separate issue and separate mechanism of action not related to testosterone. All  questions answered.  We will set this up with Dr. Ronne Binning as he has time to do this Friday - discussed one of my colleagues will be doing the procedure. ? ?No follow-ups on file. ? ?Jerilee Field, MD ? ?[facility] [place]  ?[address] ?[place], [place] [address] ?[phone] ? ? ?

## 2021-11-15 NOTE — Patient Instructions (Signed)
Dear Mr. Holtmeyer, ?  ?Thank you for choosing [facility] [place] to assist in your urologic care for your upcoming surgery. The following information below includes specific dates and details related to surgery: ?  ?The Surgical Procedure you are scheduled to have performed is scrotal debridement, incision and drainage likely left orchiectomy.  ?  ?Surgery Date: 11/19/2021 ?  ?Physician performing the surgery: Dr. Wilkie Aye ?  ?Do not eat or drink after midnight the day before your surgery.  ?  ?You will need a driver the day of surgery and will not be able to operate heavy machinery for 24 hours after.  ?  ?Your surgery will be performed at  ?[facility] ?[address] ?[place], [place] [address] ?  ?Enter at the [employer] and check in at the SAME DAY SURGERY desk.   ?  ?Pre-Admit Testing Info ?  ?Pre- Admit appointments are interview with an anesthesiologist or a pre-operative anesthesia nurse. These appointments are typically completed as an in person visit but can take place over the telephone.  You will be contacted to confirm the date and time window.  ?  ?If you have any questions or concerns, please don't hesitate to call the office at [phone] ?  ?Thank you,  ?  ?Alfredo Martinez, RN ?Clinical Surgery Coordinator ?[facility]  ? ?

## 2021-11-15 NOTE — Progress Notes (Signed)
PVR 124

## 2021-11-18 ENCOUNTER — Encounter (HOSPITAL_COMMUNITY): Payer: Self-pay

## 2021-11-18 ENCOUNTER — Encounter (HOSPITAL_COMMUNITY)
Admission: RE | Admit: 2021-11-18 | Discharge: 2021-11-18 | Disposition: A | Discharge status: Home / Self Care | Payer: Medicare Other | Source: Ambulatory Visit | Attending: Urology | Admitting: Urology

## 2021-11-18 ENCOUNTER — Other Ambulatory Visit: Payer: Self-pay

## 2021-11-18 NOTE — Progress Notes (Signed)
?   11/18/21 1104  ?OBSTRUCTIVE SLEEP APNEA  ?Have you ever been diagnosed with sleep apnea through a sleep study? No  ?Do you snore loudly (loud enough to be heard through closed doors)?  1  ?Do you often feel tired, fatigued, or sleepy during the daytime (such as falling asleep during driving or talking to someone)? 0  ?Has anyone observed you stop breathing during your sleep? 0  ?Do you have, or are you being treated for high blood pressure? 0  ?BMI more than 35 kg/m2? 1  ?Age > 50 (1-yes) 1  ?Neck circumference greater than:Male 16 inches or larger, Male 17inches or larger? 0  ?Male Gender (Yes=1) 1  ?Obstructive Sleep Apnea Score 4  ?Score 5 or greater  Results sent to PCP  ? ? ?

## 2021-11-19 ENCOUNTER — Encounter (HOSPITAL_COMMUNITY)
Admission: RE | Disposition: A | Discharge status: Home / Self Care | Payer: Self-pay | Source: Home / Self Care | Attending: Urology

## 2021-11-19 ENCOUNTER — Ambulatory Visit (HOSPITAL_COMMUNITY): Payer: Medicare Other | Admitting: Anesthesiology

## 2021-11-19 ENCOUNTER — Ambulatory Visit (HOSPITAL_COMMUNITY)
Admission: RE | Admit: 2021-11-19 | Discharge: 2021-11-19 | Disposition: A | Discharge status: Home / Self Care | Payer: Medicare Other | Attending: Urology | Admitting: Urology

## 2021-11-19 ENCOUNTER — Other Ambulatory Visit: Payer: Self-pay

## 2021-11-19 ENCOUNTER — Encounter (HOSPITAL_COMMUNITY): Payer: Self-pay | Admitting: Urology

## 2021-11-19 ENCOUNTER — Ambulatory Visit (HOSPITAL_BASED_OUTPATIENT_CLINIC_OR_DEPARTMENT_OTHER): Payer: Medicare Other | Admitting: Anesthesiology

## 2021-11-19 ENCOUNTER — Other Ambulatory Visit: Payer: Self-pay | Admitting: Urology

## 2021-11-19 DIAGNOSIS — Z7984 Long term (current) use of oral hypoglycemic drugs: Secondary | ICD-10-CM | POA: Diagnosis not present

## 2021-11-19 DIAGNOSIS — E119 Type 2 diabetes mellitus without complications: Secondary | ICD-10-CM | POA: Insufficient documentation

## 2021-11-19 DIAGNOSIS — N401 Enlarged prostate with lower urinary tract symptoms: Secondary | ICD-10-CM | POA: Diagnosis not present

## 2021-11-19 DIAGNOSIS — R3912 Poor urinary stream: Secondary | ICD-10-CM | POA: Insufficient documentation

## 2021-11-19 DIAGNOSIS — Z9049 Acquired absence of other specified parts of digestive tract: Secondary | ICD-10-CM | POA: Insufficient documentation

## 2021-11-19 DIAGNOSIS — Q631 Lobulated, fused and horseshoe kidney: Secondary | ICD-10-CM | POA: Diagnosis not present

## 2021-11-19 DIAGNOSIS — N454 Abscess of epididymis or testis: Secondary | ICD-10-CM | POA: Insufficient documentation

## 2021-11-19 DIAGNOSIS — N453 Epididymo-orchitis: Secondary | ICD-10-CM | POA: Insufficient documentation

## 2021-11-19 DIAGNOSIS — Z87891 Personal history of nicotine dependence: Secondary | ICD-10-CM | POA: Diagnosis not present

## 2021-11-19 DIAGNOSIS — N452 Orchitis: Secondary | ICD-10-CM | POA: Diagnosis not present

## 2021-11-19 DIAGNOSIS — D763 Other histiocytosis syndromes: Secondary | ICD-10-CM | POA: Diagnosis not present

## 2021-11-19 HISTORY — PX: ORCHIECTOMY: SHX2116

## 2021-11-19 HISTORY — PX: SCROTAL EXPLORATION: SHX2386

## 2021-11-19 LAB — GLUCOSE, CAPILLARY
Glucose-Capillary: 142 mg/dL — ABNORMAL HIGH (ref 70–99)
Glucose-Capillary: 132 mg/dL — ABNORMAL HIGH (ref 70–99)

## 2021-11-19 SURGERY — EXPLORATION, SCROTUM
Anesthesia: General | Site: Scrotum | Laterality: Left

## 2021-11-19 MED ORDER — FENTANYL CITRATE (PF) 100 MCG/2ML IJ SOLN
INTRAMUSCULAR | Status: AC
  Filled 2021-11-19: qty 2

## 2021-11-19 MED ORDER — DEXAMETHASONE SODIUM PHOSPHATE 10 MG/ML IJ SOLN
INTRAMUSCULAR | Status: DC | PRN
Start: 2021-11-19 — End: 2021-11-19
  Administered 2021-11-19: 10 mg via INTRAVENOUS

## 2021-11-19 MED ORDER — OXYCODONE-ACETAMINOPHEN 5-325 MG PO TABS: 1.0000 | ORAL_TABLET | ORAL | 0 refills | Status: DC | PRN

## 2021-11-19 MED ORDER — 0.9 % SODIUM CHLORIDE (POUR BTL) OPTIME
TOPICAL | Status: DC | PRN
  Administered 2021-11-19: 1000 mL

## 2021-11-19 MED ORDER — ONDANSETRON HCL 4 MG/2ML IJ SOLN
INTRAMUSCULAR | Status: DC | PRN
  Administered 2021-11-19: 4 mg via INTRAVENOUS

## 2021-11-19 MED ORDER — DOXYCYCLINE HYCLATE 100 MG PO TABS: 100.0000 mg | ORAL_TABLET | Freq: Two times a day (BID) | ORAL | 0 refills | Status: DC

## 2021-11-19 MED ORDER — MIDAZOLAM HCL 2 MG/2ML IJ SOLN
INTRAMUSCULAR | Status: AC
  Filled 2021-11-19: qty 2

## 2021-11-19 MED ORDER — PROPOFOL 10 MG/ML IV BOLUS
INTRAVENOUS | Status: AC
  Filled 2021-11-19: qty 20

## 2021-11-19 MED ORDER — SODIUM CHLORIDE 0.9 % IV SOLN: Freq: Once | INTRAVENOUS | Status: AC

## 2021-11-19 MED ORDER — ORAL CARE MOUTH RINSE: 15.0000 mL | Freq: Once | OROMUCOSAL | Status: AC

## 2021-11-19 MED ORDER — CHLORHEXIDINE GLUCONATE 0.12 % MT SOLN
15.0000 mL | Freq: Once | OROMUCOSAL | Status: AC
  Administered 2021-11-19: 15 mL via OROMUCOSAL

## 2021-11-19 MED ORDER — MIDAZOLAM HCL 2 MG/2ML IJ SOLN
INTRAMUSCULAR | Status: DC | PRN
  Administered 2021-11-19: 2 mg via INTRAVENOUS

## 2021-11-19 MED ORDER — FENTANYL CITRATE (PF) 100 MCG/2ML IJ SOLN
INTRAMUSCULAR | Status: DC | PRN
  Administered 2021-11-19: 100 ug via INTRAVENOUS

## 2021-11-19 MED ORDER — SODIUM CHLORIDE 0.9 % IV SOLN
2.0000 g | INTRAVENOUS | Status: AC
  Administered 2021-11-19: 2 g via INTRAVENOUS
  Filled 2021-11-19: qty 20

## 2021-11-19 MED ORDER — BUPIVACAINE HCL (PF) 0.25 % IJ SOLN
INTRAMUSCULAR | Status: AC
  Filled 2021-11-19: qty 30

## 2021-11-19 MED ORDER — PHENYLEPHRINE HCL (PRESSORS) 10 MG/ML IV SOLN
INTRAVENOUS | Status: DC | PRN
  Administered 2021-11-19 (×2): 120 ug via INTRAVENOUS
  Administered 2021-11-19: 160 ug via INTRAVENOUS

## 2021-11-19 MED ORDER — PROPOFOL 10 MG/ML IV BOLUS
INTRAVENOUS | Status: DC | PRN
Start: 2021-11-19 — End: 2021-11-19
  Administered 2021-11-19: 200 mg via INTRAVENOUS

## 2021-11-19 MED ORDER — LIDOCAINE HCL (CARDIAC) PF 100 MG/5ML IV SOSY
PREFILLED_SYRINGE | INTRAVENOUS | Status: DC | PRN
Start: 2021-11-19 — End: 2021-11-19
  Administered 2021-11-19: 50 mg via INTRAVENOUS

## 2021-11-19 MED ORDER — LACTATED RINGERS IV SOLN: INTRAVENOUS | Status: DC

## 2021-11-19 SURGICAL SUPPLY — 40 items
BANDAGE GAUZE ELAST BULKY 4 IN (GAUZE/BANDAGES/DRESSINGS) ×1 IMPLANT
COVER LIGHT HANDLE STERIS (MISCELLANEOUS) ×4 IMPLANT
COVER SURGICAL LIGHT HANDLE (MISCELLANEOUS) ×4 IMPLANT
DECANTER SPIKE VIAL GLASS SM (MISCELLANEOUS) ×2 IMPLANT
DRAIN PENROSE 0.5X18 (DRAIN) ×2 IMPLANT
ELECT REM PT RETURN 9FT ADLT (ELECTROSURGICAL) ×2
ELECTRODE REM PT RTRN 9FT ADLT (ELECTROSURGICAL) ×1 IMPLANT
GAUZE 4X4 16PLY ~~LOC~~+RFID DBL (SPONGE) ×1 IMPLANT
GAUZE SPONGE 4X4 12PLY STRL (GAUZE/BANDAGES/DRESSINGS) ×2 IMPLANT
GLOVE BIOGEL PI IND STRL 8.5 (GLOVE) ×1 IMPLANT
GLOVE BIOGEL PI INDICATOR 8.5 (GLOVE) ×1
GLOVE SRG 8 PF TXTR STRL LF DI (GLOVE) ×1 IMPLANT
GLOVE SURG POLYISO LF SZ8 (GLOVE) ×2 IMPLANT
GLOVE SURG UNDER POLY LF SZ7 (GLOVE) ×4 IMPLANT
GLOVE SURG UNDER POLY LF SZ8 (GLOVE) ×2
GOWN STRL REUS W/TWL LRG LVL3 (GOWN DISPOSABLE) ×2 IMPLANT
GOWN STRL REUS W/TWL XL LVL3 (GOWN DISPOSABLE) ×2 IMPLANT
KIT TURNOVER KIT A (KITS) ×2 IMPLANT
MANIFOLD NEPTUNE II (INSTRUMENTS) ×2 IMPLANT
NDL HYPO 25X1 1.5 SAFETY (NEEDLE) ×1 IMPLANT
NEEDLE HYPO 25X1 1.5 SAFETY (NEEDLE) ×2 IMPLANT
NS IRRIG 500ML POUR BTL (IV SOLUTION) IMPLANT
PACK MINOR (CUSTOM PROCEDURE TRAY) ×2 IMPLANT
PAD ABD 5X9 TENDERSORB (GAUZE/BANDAGES/DRESSINGS) ×1 IMPLANT
PAD ARMBOARD 7.5X6 YLW CONV (MISCELLANEOUS) ×2 IMPLANT
PENCIL SMOKE EVACUATOR (MISCELLANEOUS) ×2 IMPLANT
SET BASIN LINEN APH (SET/KITS/TRAYS/PACK) ×2 IMPLANT
SOL PREP POV-IOD 4OZ 10% (MISCELLANEOUS) ×2 IMPLANT
SOL PREP PROV IODINE SCRUB 4OZ (MISCELLANEOUS) ×2 IMPLANT
SPONGE T-LAP 18X18 ~~LOC~~+RFID (SPONGE) ×1 IMPLANT
SUPPORT SCROTAL LG STRP (MISCELLANEOUS) ×2 IMPLANT
SUT SILK 0 FSL (SUTURE) ×1 IMPLANT
SUT SILK 2 0 (SUTURE) ×4
SUT SILK 2-0 18XBRD TIE 12 (SUTURE) ×1 IMPLANT
SUT VIC AB 2-0 CT1 27 (SUTURE) ×2
SUT VIC AB 2-0 CT1 TAPERPNT 27 (SUTURE) IMPLANT
SUT VIC AB 2-0 SH 27 (SUTURE) ×2
SUT VIC AB 2-0 SH 27X BRD (SUTURE) ×1 IMPLANT
SYR BULB IRRIG 60ML STRL (SYRINGE) ×2 IMPLANT
SYR CONTROL 10ML LL (SYRINGE) ×2 IMPLANT

## 2021-11-19 NOTE — Transfer of Care (Signed)
Immediate Anesthesia Transfer of Care Note ? ?Patient: Laurine Blazer ? ?Procedure(s) Performed: SCROTUM EXPLORATION- Incision and debridement (Left: Scrotum) ?ORCHIECTOMY (Left: Scrotum) ? ?Patient Location: PACU ? ?Anesthesia Type:General ? ?Level of Consciousness: awake, alert , oriented and patient cooperative ? ?Airway & Oxygen Therapy: Patient Spontanous Breathing ? ?Post-op Assessment: Report given to RN, Post -op Vital signs reviewed and stable and Patient moving all extremities X 4 ? ?Post vital signs: Reviewed and stable ? ?Last Vitals:  ?Vitals Value Taken Time  ?BP 97/68 11/19/21 0956  ?Temp 37 ?C 11/19/21 0956  ?Pulse 82 11/19/21 0957  ?Resp 16 11/19/21 0958  ?SpO2 97 % 11/19/21 0957  ?Vitals shown include unvalidated device data. ? ?Last Pain:  ?Vitals:  ? 11/19/21 0956  ?TempSrc:   ?PainSc: 0-No pain  ?   ? ?Patients Stated Pain Goal: 7 (11/19/21 0739) ? ?Complications: No notable events documented. ?

## 2021-11-19 NOTE — Anesthesia Procedure Notes (Signed)
Procedure Name: LMA Insertion ?Date/Time: 11/19/2021 9:07 AM ?Performed by: Shanon Payor, CRNA ?Pre-anesthesia Checklist: Patient identified, Emergency Drugs available, Suction available, Patient being monitored and Timeout performed ?Patient Re-evaluated:Patient Re-evaluated prior to induction ?Oxygen Delivery Method: Circle system utilized ?Preoxygenation: Pre-oxygenation with 100% oxygen ?Induction Type: IV induction ?LMA: LMA inserted ?LMA Size: 5.0 ?Number of attempts: 1 ?Placement Confirmation: positive ETCO2 and breath sounds checked- equal and bilateral ?Tube secured with: Tape ?Dental Injury: Teeth and Oropharynx as per pre-operative assessment  ? ? ? ? ?

## 2021-11-19 NOTE — Anesthesia Postprocedure Evaluation (Signed)
Anesthesia Post Note ? ?Patient: Laurine Blazer ? ?Procedure(s) Performed: SCROTUM EXPLORATION- Incision and debridement (Left: Scrotum) ?ORCHIECTOMY (Left: Scrotum) ? ?Patient location during evaluation: PACU ?Anesthesia Type: General ?Level of consciousness: awake and alert and oriented ?Pain management: pain level controlled ?Vital Signs Assessment: post-procedure vital signs reviewed and stable ?Respiratory status: spontaneous breathing, nonlabored ventilation and respiratory function stable ?Cardiovascular status: blood pressure returned to baseline and stable ?Postop Assessment: no apparent nausea or vomiting ?Anesthetic complications: no ? ? ?No notable events documented. ? ? ?Last Vitals:  ?Vitals:  ? 11/19/21 1000 11/19/21 1023  ?BP: 113/83   ?Pulse: 78 83  ?Resp: 18 18  ?Temp:  36.5 ?C  ?SpO2: 97% 94%  ?  ?Last Pain:  ?Vitals:  ? 11/19/21 1023  ?TempSrc: Oral  ?PainSc: 2   ? ? ?  ?  ?  ?  ?  ?  ? ?Kimiyo Carmicheal C Marguetta Windish ? ? ? ? ?

## 2021-11-19 NOTE — Interval H&P Note (Signed)
History and Physical Interval Note: ? ?11/19/2021 ?8:29 AM ? ?Ryan Lowe  has presented today for surgery, with the diagnosis of left epididymitis orchitis with abscess.  The various methods of treatment have been discussed with the patient and family. After consideration of risks, benefits and other options for treatment, the patient has consented to  Procedure(s): ?SCROTUM EXPLORATION- debridement (N/A) ?ORCHIECTOMY (Left) as a surgical intervention.  The patient's history has been reviewed, patient examined, no change in status, stable for surgery.  I have reviewed the patient's chart and labs.  Questions were answered to the patient's satisfaction.   ? ? ?Wilkie Aye ? ? ?

## 2021-11-19 NOTE — Anesthesia Preprocedure Evaluation (Addendum)
Anesthesia Evaluation  ?Patient identified by MRN, date of birth, ID band ?Patient awake ? ? ? ?Reviewed: ?Allergy & Precautions, NPO status , Patient's Chart, lab work & pertinent test results ? ?Airway ?Mallampati: II ? ?TM Distance: >3 FB ?Neck ROM: Full ? ? ? Dental ? ?(+) Dental Advisory Given,  ?  ?Pulmonary ?shortness of breath and with exertion, former smoker,  ?  ?Pulmonary exam normal ?breath sounds clear to auscultation ? ? ? ? ? ? Cardiovascular ?+ dysrhythmias Atrial Fibrillation  ?Rhythm:Irregular Rate:Normal ? ? ?  ?Neuro/Psych ?negative neurological ROS ? negative psych ROS  ? GI/Hepatic ?negative GI ROS, Neg liver ROS,   ?Endo/Other  ?diabetes, Well Controlled, Type 2, Oral Hypoglycemic Agents ? Renal/GU ?negative Renal ROS  ?negative genitourinary ?  ?Musculoskeletal ?negative musculoskeletal ROS ?(+)  ? Abdominal ?  ?Peds ?negative pediatric ROS ?(+)  Hematology ?negative hematology ROS ?(+)   ?Anesthesia Other Findings ? ? Reproductive/Obstetrics ?negative OB ROS ? ?  ? ? ? ? ? ? ? ? ? ? ? ? ? ?  ?  ? ? ? ? ? ? ?Anesthesia Physical ?Anesthesia Plan ? ?ASA: 2 ? ?Anesthesia Plan: General  ? ?Post-op Pain Management: Dilaudid IV  ? ?Induction: Intravenous ? ?PONV Risk Score and Plan: 4 or greater and Ondansetron, Dexamethasone and Midazolam ? ?Airway Management Planned: Oral ETT and LMA ? ?Additional Equipment:  ? ?Intra-op Plan:  ? ?Post-operative Plan: Extubation in OR ? ?Informed Consent: I have reviewed the patients History and Physical, chart, labs and discussed the procedure including the risks, benefits and alternatives for the proposed anesthesia with the patient or authorized representative who has indicated his/her understanding and acceptance.  ? ? ? ? ? ?Plan Discussed with: Surgeon ? ?Anesthesia Plan Comments:   ? ? ? ?Anesthesia Quick Evaluation ? ?

## 2021-11-19 NOTE — Brief Op Note (Signed)
11/19/2021 ? ?9:40 AM ? ?PATIENT:  Ryan Lowe  66 y.o. male ? ?PRE-OPERATIVE DIAGNOSIS:  left epididymitis orchitis with abscess ? ?POST-OPERATIVE DIAGNOSIS:  left epididymitis orchitis with abscess ? ?PROCEDURE:  Procedure(s): ?SCROTUM EXPLORATION- Incision and debridement (Left) ?ORCHIECTOMY (Left) ? ?SURGEON:  Surgeon(s) and Role: ?   * Chanika Byland, Mardene Celeste, MD - Primary ? ?PHYSICIAN ASSISTANT:  ? ?ASSISTANTS: none  ? ?ANESTHESIA:   general ? ?EBL:  20 mL  ? ?BLOOD ADMINISTERED:none ? ?DRAINS:  Iodiform gauze   ? ?LOCAL MEDICATIONS USED:  NONE ? ?SPECIMEN:  Source of Specimen:  left testis and scrotal wall ? ?DISPOSITION OF SPECIMEN:  PATHOLOGY ? ?COUNTS:  YES ? ?TOURNIQUET:  * No tourniquets in log * ? ?DICTATION: .Note written in EPIC ? ?PLAN OF CARE: Discharge to home after PACU ? ?PATIENT DISPOSITION:  PACU - hemodynamically stable. ?  ?Delay start of Pharmacological VTE agent (>24hrs) due to surgical blood loss or risk of bleeding: not applicable ? ?

## 2021-11-22 ENCOUNTER — Ambulatory Visit (INDEPENDENT_AMBULATORY_CARE_PROVIDER_SITE_OTHER): Payer: Medicare Other | Admitting: Urology

## 2021-11-22 ENCOUNTER — Other Ambulatory Visit: Payer: Self-pay

## 2021-11-22 ENCOUNTER — Encounter (HOSPITAL_COMMUNITY): Payer: Self-pay | Admitting: Urology

## 2021-11-22 VITALS — BP 106/71 | HR 130

## 2021-11-22 DIAGNOSIS — N451 Epididymitis: Secondary | ICD-10-CM

## 2021-11-24 LAB — SURGICAL PATHOLOGY

## 2021-11-25 ENCOUNTER — Ambulatory Visit: Payer: Medicare Other | Admitting: Physician Assistant

## 2021-11-25 ENCOUNTER — Encounter: Payer: Self-pay | Admitting: Urology

## 2021-11-25 NOTE — Patient Instructions (Signed)
Orchitis ?Orchitis is inflammation of a testicle. Testicles are the male organs that produce sperm. The testicles are held in a fleshy sac (scrotum) located behind the penis. Orchitis usually affects only one testicle, but it can affect both. ?Orchitis is caused by infection. Many kinds of bacteria and viruses can cause this infection. The condition can develop suddenly. ?What are the causes? ?This condition may be caused by: ?Infection from viruses or bacteria. ?Other organisms, such as fungi or parasites. This is rare but can happen in men who have a weak body defense system (immune system), such as men who have HIV. ?Bacteria  ?Bacterial orchitis often occurs along with an infection of the tube that collects and stores sperm (epididymis). ?In men who are not sexually active, this infection usually starts as a urinary tract infection and spreads to the testicle. ?In sexually active men, sexually transmitted infections (STIs) are the most common cause of bacterial orchitis. These can include: ?Gonorrhea. ?Chlamydia. ?Viruses ?Mumps is the most common cause of viral orchitis, though mumps is now rare in many areas because of vaccination. ?Other viruses that can cause orchitis include: ?The chickenpox virus (varicella-zoster virus). ?The virus that causes mononucleosis (Epstein-Barr virus). ?What increases the risk? ?The following factors may make you more likely to develop this condition: ?For viral orchitis: ?Not having been vaccinated against mumps. ?For bacterial orchitis: ?Having had frequent urinary tract infections. ?Engaging in high-risk sexual behaviors, such as having multiple sexual partners or having sex without using a condom. ?Having a sexual partner with an STI. ?Having had urinary tract surgery. ?Using a tube that is passed through the penis to drain urine (Foley catheter). ?Having an enlarged prostate gland. ?What are the signs or symptoms? ?The most common symptoms of orchitis are swelling and pain  in the scrotum. Other signs and symptoms may include: ?Feeling generally sick (malaise). ?Fever and chills. ?Painful urination. ?Painful ejaculation. ?Headache. ?Fatigue. ?Nausea. ?Blood or discharge from the penis. ?Swollen lymph nodes in the groin area (inguinal nodes). ?How is this diagnosed? ?This condition may be diagnosed based on: ?Your symptoms. Your health care provider may suspect orchitis if you have a painful, swollen testicle along with other signs and symptoms of the condition. ?A physical exam. ?You may also have other tests, including: ?A blood test to check for signs of infection. ?A urine test to check for a urinary tract infection or STI. ?Using a swab to collect a fluid sample from the tip of the penis to test for STIs. ?Taking an image of the testicle using sound waves and a computer (testicular ultrasound). ?How is this treated? ?Treatment for this condition depends on the cause.  ?For bacterial orchitis, your health care provider may prescribe antibiotic medicines. Bacterial infections usually clear up within a few days. ?For both viral infections and bacterial infections, treatment may include: ?Rest. ?Anti-inflammatory medicines. ?Pain medicines. ?Raising (elevating) the scrotum with a towel or pillow and applying ice. ?Follow these instructions at home: ?Managing pain and swelling ?Elevate your scrotum and apply ice as directed. To do this: ?Put ice in a plastic bag. ?Place a small towel or pillow between your legs. ?Rest your scrotum on the pillow or towel. ?Place another towel between your skin and the plastic bag. ?Leave the ice on for 20 minutes, 2-3 times a day. ?Remove the ice if your skin turns bright red. This is very important. If you cannot feel pain, heat, or cold, you have a greater risk of damage to the area. ?General instructions ?Rest   as told by your health care provider. ?Take over-the-counter and prescription medicines only as told by your health care provider. ?If you were  prescribed an antibiotic medicine, take it as told by your health care provider. Do not stop taking the antibiotic even if you start to feel better. ?Do not have sex until your health care provider says it is okay to do so. ?Keep all follow-up visits. This is important. ?Contact a health care provider if: ?You have a fever. ?Pain and swelling have not gotten better after 3 days. ?Get help right away if: ?Your pain is getting worse. ?The swelling in your testicle gets worse. ?Summary ?Orchitis is inflammation of a testicle. It is caused by an infection from bacteria or a virus. ?The most common symptoms of orchitis are swelling and pain in the scrotum. ?Treatment for this condition depends on the cause. It may include medicines to fight the infection, reduce inflammation, and relieve the pain. ?Follow your health care provider's instructions about resting, icing, not having sex, and taking medicines. ?This information is not intended to replace advice given to you by your health care provider. Make sure you discuss any questions you have with your health care provider. ?Document Revised: 02/23/2021 Document Reviewed: 02/23/2021 ?Elsevier Patient Education ? 2022 [employer] ? ?

## 2021-11-25 NOTE — Progress Notes (Signed)
? ?11/22/2021 ?12:29 PM  ? ?Ryan Lowe ?[birth year] ?[id number] ? ?Referring provider: Ignatius Specking, MD ?[address] ?[place],  [place] [address] ? ?Followup left orchiectomy ? ? ?HPI: ?Ryan Lowe is a 65yo here for followup after left orchiectomy. He is having purulent drainage from the incision. Packing is in place. Mild left scrotal pain ? ? ?PMH: ?Past Medical History:  ?Diagnosis Date  ? Atrial fibrillation (HCC)   ? Diabetes mellitus without complication (HCC)   ? ? ?Surgical History: ?Past Surgical History:  ?Procedure Laterality Date  ? COLON RESECTION  12/21/2020  ? removal of right colon  ? ORCHIECTOMY Left 11/19/2021  ? Procedure: ORCHIECTOMY;  Surgeon: Malen Gauze, MD;  Location: [facility];  Service: Urology;  Laterality: Left;  ? SCROTAL EXPLORATION Left 11/19/2021  ? Procedure: SCROTUM EXPLORATION- Incision and debridement;  Surgeon: Malen Gauze, MD;  Location: [facility];  Service: Urology;  Laterality: Left;  ? ? ?Home Medications:  ?Allergies as of 11/22/2021   ? ?   Reactions  ? Tamsulosin Other (See Comments)  ? Abdominal bloating, nausea, fatigue, dysnea  ? ?  ? ?  ?Medication List  ?  ? ?  ? Accurate as of November 22, 2021 11:59 PM. If you have any questions, ask your nurse or doctor.  ?  ?  ? ?  ? ?aspirin 325 MG tablet ?Take 325 mg by mouth daily. ?  ?diltiazem 300 MG 24 hr capsule ?Commonly known as: CARDIZEM CD ?Take 300 mg by mouth daily. ?  ?doxycycline 100 MG tablet ?Commonly known as: VIBRA-TABS ?Take 1 tablet (100 mg total) by mouth 2 (two) times daily. ?  ?fish oil-omega-3 fatty acids 1000 MG capsule ?Take 1 capsule by mouth daily. ?  ?glipiZIDE 5 MG tablet ?Commonly known as: GLUCOTROL ?Take 5 mg by mouth daily. ?  ?One-A-Day Weight Smart Advance Tabs ?Take 1 tablet by mouth daily. ?  ?oxyCODONE-acetaminophen 5-325 MG tablet ?Commonly known as: Percocet ?Take 1 tablet by mouth every 4 (four) hours as needed for severe pain. ?  ?SULFAMETHOXAZOLE-TMP DS PO ?Take 1 tablet by mouth 2 (two)  times daily. ?  ? ?  ? ? ?Allergies:  ?Allergies  ?Allergen Reactions  ? Tamsulosin Other (See Comments)  ?  Abdominal bloating, nausea, fatigue, dysnea  ? ? ?Family History: ?Family History  ?Problem Relation Age of Onset  ? Heart disease Other   ? Heart failure Other   ? ? ?Social History:  reports that he has quit smoking. His smoking use included cigarettes. He quit smokeless tobacco use about 30 years ago.  His smokeless tobacco use included chew. He reports that he does not drink alcohol and does not use drugs. ? ?ROS: ?All other review of systems were reviewed and are negative except what is noted above in HPI ? ?Physical Exam: ?BP 106/71   Pulse (!) 130   ?Constitutional:  Alert and oriented, No acute distress. ?HEENT: [place] AT, moist mucus membranes.  Trachea midline, no masses. ?Cardiovascular: No clubbing, cyanosis, or edema. ?Respiratory: Normal respiratory effort, no increased work of breathing. ?GI: Abdomen is soft, nontender, nondistended, no abdominal masses ?GU: No CVA tenderness. Circumcised phallus. Left scrotal incision clean. ?Skin: No rashes, bruises or suspicious lesions. ?Neurologic: Grossly intact, no focal deficits, moving all 4 extremities. ?Psychiatric: Normal mood and affect. ? ?Laboratory Data: ?Lab Results  ?Component Value Date  ? WBC 20.2 (H) 10/20/2021  ? HGB 13.7 10/20/2021  ? HCT 42.2 10/20/2021  ? MCV  92.1 10/20/2021  ? PLT 195 10/20/2021  ? ? ?Lab Results  ?Component Value Date  ? CREATININE 1.00 10/20/2021  ? ? ?No results found for: PSA ? ?No results found for: TESTOSTERONE ? ?No results found for: HGBA1C ? ?Urinalysis ?   ?Component Value Date/Time  ? COLORURINE YELLOW 10/20/2021 1616  ? APPEARANCEUR Clear 11/15/2021 1117  ? LABSPEC 1.023 10/20/2021 1616  ? PHURINE 5.0 10/20/2021 1616  ? GLUCOSEU Negative 11/15/2021 1117  ? HGBUR SMALL (A) 10/20/2021 1616  ? BILIRUBINUR Negative 11/15/2021 1117  ? KETONESUR NEGATIVE 10/20/2021 1616  ? PROTEINUR Negative 11/15/2021 1117  ?  PROTEINUR 100 (A) 10/20/2021 1616  ? NITRITE Negative 11/15/2021 1117  ? NITRITE NEGATIVE 10/20/2021 1616  ? LEUKOCYTESUR Negative 11/15/2021 1117  ? LEUKOCYTESUR LARGE (A) 10/20/2021 1616  ? ? ?Lab Results  ?Component Value Date  ? LABMICR Comment 11/15/2021  ? BACTERIA RARE (A) 10/20/2021  ? ? ?Pertinent Imaging: ? ?No results found for this or any previous visit. ? ?No results found for this or any previous visit. ? ?No results found for this or any previous visit. ? ?No results found for this or any previous visit. ? ?No results found for this or any previous visit. ? ?No results found for this or any previous visit. ? ?No results found for this or any previous visit. ? ?No results found for this or any previous visit. ? ? ?Assessment & Plan:   ? ?1. Epididymitis with no abscess ?Packing changed today. Patient will followup Friday for packing change ? ? ?No follow-ups on file. ? ?Wilkie Aye, MD ? ?[facility] [place] ?  ?

## 2021-11-26 ENCOUNTER — Ambulatory Visit (INDEPENDENT_AMBULATORY_CARE_PROVIDER_SITE_OTHER): Payer: Medicare Other | Admitting: Urology

## 2021-11-26 VITALS — BP 117/81 | HR 99

## 2021-11-26 DIAGNOSIS — N451 Epididymitis: Secondary | ICD-10-CM

## 2021-11-29 ENCOUNTER — Ambulatory Visit: Payer: Medicare Other | Admitting: Urology

## 2021-11-29 ENCOUNTER — Encounter: Payer: Self-pay | Admitting: Urology

## 2021-11-29 VITALS — BP 108/71 | HR 93

## 2021-11-29 DIAGNOSIS — N454 Abscess of epididymis or testis: Secondary | ICD-10-CM

## 2021-11-29 NOTE — Progress Notes (Signed)
? ?11/29/2021 ?2:43 PM  ? ?Ryan Lowe ?1956-08-03 ?[id number] ? ?Referring provider: Ignatius Specking, MD ?[address] ?[place],  [place] [address] ? ?No chief complaint on file. ? ? ?HPI: ? ?F/u -  ? ?  ?1) left epididymitis-he developed left scrotal pain and swelling.  He went to emergency 10/20/2021 with fever to 102. Scrotal ultrasound revealed an enlarged left epididymis with some surrounding edema consistent with epididymitis.  Testicles appeared normal without mass.  He also underwent CT scan of the abdomen and pelvis which was benign.  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  Urine cx grew [place]. He ultimately underwent left orchiectomy 11/19/2021 for abscess and drainage from a necrotic area of scrotum. Path benign c/w infection.   ?  ?2) BPH - prostate about 30 g on CT 02/23 done for left E-O. No GU meds or surgery. H/o right colectomy April 2022 with prolonged retention after surgery for several weeks. He could not tolerate alpha blockers - tried alfuzosin and tamsulosin. He declined cystoscopy. PVRs ranged 161-400. 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  ?  ?PVR 139 ml - he could not void prior. Reports he's voiding with an adequate flow with PVR 124 ml.  ? ?Today, seen for the above. No fever or scrotal swelling.  ? ? ?PMH: ?Past Medical History:  ?Diagnosis Date  ? Atrial fibrillation (HCC)   ? Diabetes mellitus without complication (HCC)   ? ? ?Surgical History: ?Past Surgical History:  ?Procedure Laterality Date  ? COLON RESECTION  12/21/2020  ? removal of right colon  ? ORCHIECTOMY Left 11/19/2021  ? Procedure: ORCHIECTOMY;  Surgeon: Malen Gauze, MD;  Location: [facility];  Service: Urology;  Laterality: Left;  ? SCROTAL EXPLORATION Left 11/19/2021  ? Procedure: SCROTUM EXPLORATION- Incision and debridement;  Surgeon: Malen Gauze, MD;  Location: [facility];  Service: Urology;  Laterality: Left;  ? ? ?Home Medications:  ?Allergies as of 11/29/2021   ? ?   Reactions  ?  Tamsulosin Other (See Comments)  ? Abdominal bloating, nausea, fatigue, dysnea  ? ?  ? ?  ?Medication List  ?  ? ?  ? Accurate as of November 29, 2021  2:43 PM. If you have any questions, ask your nurse or doctor.  ?  ?  ? ?  ? ?aspirin 325 MG tablet ?Take 325 mg by mouth daily. ?  ?diltiazem 300 MG 24 hr capsule ?Commonly known as: CARDIZEM CD ?Take 300 mg by mouth daily. ?  ?doxycycline 100 MG tablet ?Commonly known as: VIBRA-TABS ?Take 1 tablet (100 mg total) by mouth 2 (two) times daily. ?  ?fish oil-omega-3 fatty acids 1000 MG capsule ?Take 1 capsule by mouth daily. ?  ?glipiZIDE 5 MG tablet ?Commonly known as: GLUCOTROL ?Take 5 mg by mouth daily. ?  ?One-A-Day Weight Smart Advance Tabs ?Take 1 tablet by mouth daily. ?  ?oxyCODONE-acetaminophen 5-325 MG tablet ?Commonly known as: Percocet ?Take 1 tablet by mouth every 4 (four) hours as needed for severe pain. ?  ?SULFAMETHOXAZOLE-TMP DS PO ?Take 1 tablet by mouth 2 (two) times daily. ?  ? ?  ? ? ?Allergies:  ?Allergies  ?Allergen Reactions  ? Tamsulosin Other (See Comments)  ?  Abdominal bloating, nausea, fatigue, dysnea  ? ? ?Family History: ?Family History  ?Problem Relation Age of Onset  ? Heart disease Other   ? Heart failure Other   ? ? ?Social History:  reports that he has quit smoking. His smoking use included cigarettes. He quit smokeless tobacco use about 30 years ago.  His smokeless tobacco use included chew. He reports that he does not drink alcohol and does not use drugs. ? ? ?Physical Exam: ?There were no vitals taken for this visit.  ?Constitutional:  Alert and oriented, No acute distress. ?HEENT: [place] AT, moist mucus membranes.  Trachea midline, no masses. ?Cardiovascular: No clubbing, cyanosis, or edema. ?Respiratory: Normal respiratory effort, no increased work of breathing. ?GI: Abdomen is soft, nontender, nondistended, no abdominal masses ?GU: No CVA tenderness ?Lymph: No cervical or inguinal lymphadenopathy. ?Skin: No rashes, bruises or suspicious  lesions. ?Neurologic: Grossly intact, no focal deficits, moving all 4 extremities. ?Psychiatric: Normal mood and affect. ?GU: healing well - incision superiorly C/D/I - packing removed. Healthy granulation tissue in wound bed. No scrotal edema or erythema. Right testicle palpably normal.  ? ?Laboratory Data: ?Lab Results  ?Component Value Date  ? WBC 20.2 (H) 10/20/2021  ? HGB 13.7 10/20/2021  ? HCT 42.2 10/20/2021  ? MCV 92.1 10/20/2021  ? PLT 195 10/20/2021  ? ? ?Lab Results  ?Component Value Date  ? CREATININE 1.00 10/20/2021  ? ? ?No results found for: PSA ? ?No results found for: TESTOSTERONE ? ?No results found for: HGBA1C ? ?Urinalysis ?   ?Component Value Date/Time  ? COLORURINE YELLOW 10/20/2021 1616  ? APPEARANCEUR Clear 11/15/2021 1117  ? LABSPEC 1.023 10/20/2021 1616  ? PHURINE 5.0 10/20/2021 1616  ? GLUCOSEU Negative 11/15/2021 1117  ? HGBUR SMALL (A) 10/20/2021 1616  ? BILIRUBINUR Negative 11/15/2021 1117  ? KETONESUR NEGATIVE 10/20/2021 1616  ? PROTEINUR Negative 11/15/2021 1117  ? PROTEINUR 100 (A) 10/20/2021 1616  ? NITRITE Negative 11/15/2021 1117  ? NITRITE NEGATIVE 10/20/2021 1616  ? LEUKOCYTESUR Negative 11/15/2021 1117  ? LEUKOCYTESUR LARGE (A) 10/20/2021 1616  ? ? ?Lab Results  ?Component Value Date  ? LABMICR Comment 11/15/2021  ? BACTERIA RARE (A) 10/20/2021  ? ? ? ? ?Assessment & Plan:   ?Left epididymo-orchitis with left abscess - s/p left orchiectomy 11/19/2021. Stable post-op. Continue packing change in 48 hours.  ? ? ?No follow-ups on file. ? ?Jerilee Field, MD ? ?[facility] [place]  ?[address] ?[place], [place] [address] ?[phone] ?  ?

## 2021-11-29 NOTE — Op Note (Signed)
Preoperative diagnosis: Left testicular abscess ? ?Postoperative diagnosis: Same ? ?Procedure: 1. Left simple orchiectomy ?2. Debridement of scrotum ? ?Attending: Rosie Fate, MD ? ?Anesthesia: General ? ?History of blood loss: Minimal ? ?Antibiotics: ancef ? ?Drains: none ? ?Specimens: Left testis and scrotal skin ? ?Findings:  left testis that was densely adherent to the scrotal wall.  5x6cm area of scrotal skin surround left testis excised due to necrosis. Iodiform packing placed in wound ? ?Indications: Patient is a 66 year old male with a history of left testicular abscess that spontaneously drained to the scrotal wall. On exam he was found to have necrosis of the left scrotal skin.  We discussed the treatment options including observation versus orchiectomy after discussing treatment options he decided to proceed with orchiectomy and debridement of the scrotal skin..  ? ?Procedure in detail: Prior to procedure consent was obtained.  Patient was brought to the operating room and a brief timeout was done to ensure correct patient, correct procedure, correct site.  General anesthesia was administered and patient was placed in supine position.  His genitalia and abdomen was then prepped and draped in usual sterile fashion.  A 5 cm incision was made in the above the abscess cavity.  We then proceeded to bluntly and sharply dissected the attachments of the testicle to the scrotum.  The testis was densely adherent to the scortal wall and there was a large area of necrosis so we proceeded to make an elliptical incision around the abscess cavity. Once this was done the testicle was then brought into the operative field.  We then proceeded to dissected the gubernaculum with electrocautery.  Once the testicle was freed from its attachments were then turned our attention the spermatic cord.  We separated the spermatic cord into 3 distinct packets.  The packets were then ligated with 0 silk ties.  Once this was done  we then sharply cut the spermatic cord and the spot testicle was then sent for pathology.  We then inspected the scrotum in the operative bed and we noted no residual bleeding.  We then elected to pack the wound with iodiform gauze. We then loosely closed the incision with interrupted 2-0 vicryl.   A dressing was then applied to the incision.  We then placed a scrotal fluff and this then concluded the procedure which was well tolerated by the patient. ? ?Complications: None ? ?Condition: Stable, extubated, transferred to PACU. ? ?Plan: Patient is to be discharged home.  He is to follow up in 2 days for wound check and packing exchange ? ?

## 2021-11-30 ENCOUNTER — Encounter: Payer: Self-pay | Admitting: Urology

## 2021-11-30 NOTE — Patient Instructions (Signed)
Skin Abscess  A skin abscess is an infected area on or under your skin that contains a collection of pus and other material. An abscess may also be called a furuncle,carbuncle, or boil. An abscess can occur in or on almost any part of your body. Some abscesses break open (rupture) on their own. Most continue to get worse unless they are treated. The infection can spread deeper into the body and eventually into your blood, whichcan make you feel ill. Treatment usually involves draining the abscess. What are the causes? An abscess occurs when germs, like bacteria, pass through your skin and cause an infection. This may be caused by: A scrape or cut on your skin. A puncture wound through your skin, including a needle injection or insect bite. Blocked oil or sweat glands. Blocked and infected hair follicles. A cyst that forms beneath your skin (sebaceous cyst) and becomes infected. What increases the risk? This condition is more likely to develop in people who: Have a weak body defense system (immune system). Have diabetes. Have dry and irritated skin. Get frequent injections or use illegal IV drugs. Have a foreign body in a wound, such as a splinter. Have problems with their lymph system or veins. What are the signs or symptoms? Symptoms of this condition include: A painful, firm bump under the skin. A bump with pus at the top. This may break through the skin and drain. Other symptoms include: Redness surrounding the abscess site. Warmth. Swelling of the lymph nodes (glands) near the abscess. Tenderness. A sore on the skin. How is this diagnosed? This condition may be diagnosed based on: A physical exam. Your medical history. A sample of pus. This may be used to find out what is causing the infection. Blood tests. Imaging tests, such as an ultrasound, CT scan, or MRI. How is this treated? A small abscess that drains on its own may not need treatment. Treatment for larger abscesses  may include: Moist heat or heat pack applied to the area several times a day. A procedure to drain the abscess (incision and drainage). Antibiotic medicines. For a severe abscess, you may first get antibiotics through an IV and then change to antibiotics by mouth. Follow these instructions at home: Medicines  Take over-the-counter and prescription medicines only as told by your health care provider. If you were prescribed an antibiotic medicine, take it as told by your health care provider. Do not stop taking the antibiotic even if you start to feel better.  Abscess care  If you have an abscess that has not drained, apply heat to the affected area. Use the heat source that your health care provider recommends, such as a moist heat pack or a heating pad. Place a towel between your skin and the heat source. Leave the heat on for 20-30 minutes. Remove the heat if your skin turns bright red. This is especially important if you are unable to feel pain, heat, or cold. You may have a greater risk of getting burned. Follow instructions from your health care provider about how to take care of your abscess. Make sure you: Cover the abscess with a bandage (dressing). Change your dressing or gauze as told by your health care provider. Wash your hands with soap and water before you change the dressing or gauze. If soap and water are not available, use hand sanitizer. Check your abscess every day for signs of a worsening infection. Check for: More redness, swelling, or pain. More fluid or blood. Warmth. More   pus or a bad smell.  General instructions To avoid spreading the infection: Do not share personal care items, towels, or hot tubs with others. Avoid making skin contact with other people. Keep all follow-up visits as told by your health care provider. This is important. Contact a health care provider if you have: More redness, swelling, or pain around your abscess. More fluid or blood coming  from your abscess. Warm skin around your abscess. More pus or a bad smell coming from your abscess. A fever. Muscle aches. Chills or a general ill feeling. Get help right away if you: Have severe pain. See red streaks on your skin spreading away from the abscess. Summary A skin abscess is an infected area on or under your skin that contains a collection of pus and other material. A small abscess that drains on its own may not need treatment. Treatment for larger abscesses may include having a procedure to drain the abscess and taking an antibiotic. This information is not intended to replace advice given to you by your health care provider. Make sure you discuss any questions you have with your healthcare provider. Document Revised: 12/06/2018 Document Reviewed: 09/28/2017 Elsevier Patient Education  2022 [employer] Inc.  

## 2021-11-30 NOTE — Progress Notes (Signed)
? ?11/26/2021 ?10:06 AM  ? ?Ryan Lowe ?1955-09-21 ?[id number] ? ?Referring provider: Glenda Chroman, MD ?[address] ?[place],  [place] [address] ? ?Followup left orchiectomy ? ? ?HPI: ?Ryan Lowe is a 66yo here for followup after left orchiectomy. Pathology benign. Drainage decreased since last visit. No other complaints today ? ? ?PMH: ?Past Medical History:  ?Diagnosis Date  ? Atrial fibrillation ([place])   ? Diabetes mellitus without complication ([place])   ? ? ?Surgical History: ?Past Surgical History:  ?Procedure Laterality Date  ? COLON RESECTION  12/21/2020  ? removal of right colon  ? ORCHIECTOMY Left 11/19/2021  ? Procedure: ORCHIECTOMY;  Surgeon: Cleon Gustin, MD;  Location: [facility];  Service: Urology;  Laterality: Left;  ? SCROTAL EXPLORATION Left 11/19/2021  ? Procedure: SCROTUM EXPLORATION- Incision and debridement;  Surgeon: Cleon Gustin, MD;  Location: [facility];  Service: Urology;  Laterality: Left;  ? ? ?Home Medications:  ?Allergies as of 11/26/2021   ? ?   Reactions  ? Tamsulosin Other (See Comments)  ? Abdominal bloating, nausea, fatigue, dysnea  ? ?  ? ?  ?Medication List  ?  ? ?  ? Accurate as of November 26, 2021 11:59 PM. If you have any questions, ask your nurse or doctor.  ?  ?  ? ?  ? ?aspirin 325 MG tablet ?Take 325 mg by mouth daily. ?  ?diltiazem 300 MG 24 hr capsule ?Commonly known as: CARDIZEM CD ?Take 300 mg by mouth daily. ?  ?doxycycline 100 MG tablet ?Commonly known as: VIBRA-TABS ?Take 1 tablet (100 mg total) by mouth 2 (two) times daily. ?  ?fish oil-omega-3 fatty acids 1000 MG capsule ?Take 1 capsule by mouth daily. ?  ?glipiZIDE 5 MG tablet ?Commonly known as: GLUCOTROL ?Take 5 mg by mouth daily. ?  ?One-A-Day Weight Smart Advance Tabs ?Take 1 tablet by mouth daily. ?  ?oxyCODONE-acetaminophen 5-325 MG tablet ?Commonly known as: Percocet ?Take 1 tablet by mouth every 4 (four) hours as needed for severe pain. ?  ?SULFAMETHOXAZOLE-TMP DS PO ?Take 1 tablet by mouth 2 (two) times daily. ?   ? ?  ? ? ?Allergies:  ?Allergies  ?Allergen Reactions  ? Tamsulosin Other (See Comments)  ?  Abdominal bloating, nausea, fatigue, dysnea  ? ? ?Family History: ?Family History  ?Problem Relation Age of Onset  ? Heart disease Other   ? Heart failure Other   ? ? ?Social History:  reports that he has quit smoking. His smoking use included cigarettes. He quit smokeless tobacco use about 30 years ago.  His smokeless tobacco use included chew. He reports that he does not drink alcohol and does not use drugs. ? ?ROS: ?All other review of systems were reviewed and are negative except what is noted above in HPI ? ?Physical Exam: ?BP 117/81   Pulse 99   ?Constitutional:  Alert and oriented, No acute distress. ?HEENT: [place] AT, moist mucus membranes.  Trachea midline, no masses. ?Cardiovascular: No clubbing, cyanosis, or edema. ?Respiratory: Normal respiratory effort, no increased work of breathing. ?GI: Abdomen is soft, nontender, nondistended, no abdominal masses ?GU: No CVA tenderness.  ?Lymph: No cervical or inguinal lymphadenopathy. ?Skin: No rashes, bruises or suspicious lesions. ?Neurologic: Grossly intact, no focal deficits, moving all 4 extremities. ?Psychiatric: Normal mood and affect. ? ?Laboratory Data: ?Lab Results  ?Component Value Date  ? WBC 20.2 (H) 10/20/2021  ? HGB 13.7 10/20/2021  ? HCT 42.2 10/20/2021  ? MCV 92.1 10/20/2021  ? PLT  195 10/20/2021  ? ? ?Lab Results  ?Component Value Date  ? CREATININE 1.00 10/20/2021  ? ? ?No results found for: PSA ? ?No results found for: TESTOSTERONE ? ?No results found for: HGBA1C ? ?Urinalysis ?   ?Component Value Date/Time  ? [place] YELLOW 10/20/2021 1616  ? APPEARANCEUR Clear 11/15/2021 1117  ? LABSPEC 1.023 10/20/2021 1616  ? PHURINE 5.0 10/20/2021 1616  ? GLUCOSEU Negative 11/15/2021 1117  ? HGBUR SMALL (A) 10/20/2021 1616  ? BILIRUBINUR Negative 11/15/2021 1117  ? [place] NEGATIVE 10/20/2021 1616  ? PROTEINUR Negative 11/15/2021 1117  ? PROTEINUR 100 (A)  10/20/2021 1616  ? NITRITE Negative 11/15/2021 1117  ? NITRITE NEGATIVE 10/20/2021 1616  ? LEUKOCYTESUR Negative 11/15/2021 1117  ? LEUKOCYTESUR LARGE (A) 10/20/2021 1616  ? ? ?Lab Results  ?Component Value Date  ? LABMICR Comment 11/15/2021  ? BACTERIA RARE (A) 10/20/2021  ? ? ?Pertinent Imaging: ? ?No results found for this or any previous visit. ? ?No results found for this or any previous visit. ? ?No results found for this or any previous visit. ? ?No results found for this or any previous visit. ? ?No results found for this or any previous visit. ? ?No results found for this or any previous visit. ? ?No results found for this or any previous visit. ? ?No results found for this or any previous visit. ? ? ?Assessment & Plan:   ? ?1. Epididymitis with no abscess ?-packing changed today. RTC Monday for packing change ?- Urinalysis, Routine w reflex microscopic ? ? ?No follow-ups on file. ? ?Nicolette Bang, MD ? ?[facility] ?  ?

## 2021-12-01 ENCOUNTER — Ambulatory Visit (INDEPENDENT_AMBULATORY_CARE_PROVIDER_SITE_OTHER): Payer: Medicare Other | Admitting: Urology

## 2021-12-01 VITALS — BP 109/76 | HR 101

## 2021-12-01 DIAGNOSIS — N454 Abscess of epididymis or testis: Secondary | ICD-10-CM

## 2021-12-01 MED ORDER — CLOTRIMAZOLE-BETAMETHASONE 1-0.05 % EX CREA: 1.0000 "application " | TOPICAL_CREAM | Freq: Two times a day (BID) | CUTANEOUS | 0 refills | Status: DC

## 2021-12-03 ENCOUNTER — Ambulatory Visit (INDEPENDENT_AMBULATORY_CARE_PROVIDER_SITE_OTHER): Payer: Medicare Other | Admitting: Urology

## 2021-12-03 VITALS — BP 107/69 | HR 116

## 2021-12-03 DIAGNOSIS — N454 Abscess of epididymis or testis: Secondary | ICD-10-CM

## 2021-12-07 ENCOUNTER — Ambulatory Visit (INDEPENDENT_AMBULATORY_CARE_PROVIDER_SITE_OTHER): Payer: Medicare Other | Admitting: Physician Assistant

## 2021-12-07 VITALS — BP 125/75 | HR 99 | Ht 78.0 in | Wt 320.0 lb

## 2021-12-07 DIAGNOSIS — N454 Abscess of epididymis or testis: Secondary | ICD-10-CM

## 2021-12-07 NOTE — Progress Notes (Signed)
? ?Assessment: ?1. Orchitis, epididymitis, and epididymo-orchitis, with abscess   ? ? ?Plan: ?Iodoform removed without difficulty today.  We will follow-up in 2 days for repeat packing change ? ?Chief Complaint: ?No chief complaint on file. ? ? ?HPI: ?Ryan Lowe is a 66 y.o. male s/p left orchiectomy 11/19/2021 who presents for continued evaluation of scrotal abscess wound. Patient states he has been doing well since last wound packing change and reports no increase in drainage, foul odor, increase in pain. ? ?Portions of the above documentation were copied from a prior visit for review purposes only. ? ?Allergies: ?Allergies  ?Allergen Reactions  ? Tamsulosin Other (See Comments)  ?  Abdominal bloating, nausea, fatigue, dysnea  ? ? ?PMH: ?Past Medical History:  ?Diagnosis Date  ? Atrial fibrillation ([facility])   ? Diabetes mellitus without complication ([facility])   ? ? ?PSH: ?Past Surgical History:  ?Procedure Laterality Date  ? COLON RESECTION  12/21/2020  ? removal of right colon  ? ORCHIECTOMY Left 11/19/2021  ? Procedure: ORCHIECTOMY;  Surgeon: Malen Gauze, MD;  Location: [facility];  Service: Urology;  Laterality: Left;  ? SCROTAL EXPLORATION Left 11/19/2021  ? Procedure: SCROTUM EXPLORATION- Incision and debridement;  Surgeon: Malen Gauze, MD;  Location: [facility];  Service: Urology;  Laterality: Left;  ? ? ?SH: ?Social History  ? ?Tobacco Use  ? Smoking status: Former  ?  Types: Cigarettes  ? Smokeless tobacco: Former  ?  Types: Chew  ?  Quit date: 08/30/1991  ? Tobacco comments:  ?  Occasional cig mostly dipped.  ?Vaping Use  ? Vaping Use: Never used  ?Substance Use Topics  ? Alcohol use: No  ? Drug use: No  ? ? ?ROS: ?Constitutional:  Negative for fever, chills, weight loss ?CV: Negative for chest pain ?Respiratory:  Negative for shortness of breath, wheezing, sleep apnea, frequent cough ?GI:  Negative for nausea, vomiting, bloody stool, GERD ? ?PE: ?BP 125/75   Pulse 99   Ht 6\' 6"  (1.981 m)   Wt (!) 320  lb (145.2 kg)   BMI 36.98 kg/m?  ?GENERAL APPEARANCE:  Well appearing, well developed, well nourished, NAD ?HEENT:  Atraumatic, normocephalic ?NECK:  Supple. Trachea midline ?ABDOMEN:  Soft, non-tender, no masses ?GU: Left-sided scrotal wound is clean with mild serosanguineous drainage.  ?MENTAL STATUS:  appropriate ?BACK:  Non-tender to palpation, No CVAT ?SKIN:  Warm, dry, and intact ? ?Procedures  ?Using sterile instruments and strict clean procedure techniques, packing gauze removed from left-sided scrotal wound and replaced with sterile packing gauze.  Sterile dressing applied over the packing.  Patient tolerated procedure well.  It was performed without complications. ? ?Results: ?Laboratory Data: ?Lab Results  ?Component Value Date  ? WBC 20.2 (H) 10/20/2021  ? HGB 13.7 10/20/2021  ? HCT 42.2 10/20/2021  ? MCV 92.1 10/20/2021  ? PLT 195 10/20/2021  ? ? ?Lab Results  ?Component Value Date  ? CREATININE 1.00 10/20/2021  ? ? ?No results found for: PSA ? ?No results found for: TESTOSTERONE ? ?No results found for: HGBA1C ? ?Urinalysis ?   ?Component Value Date/Time  ? COLORURINE YELLOW 10/20/2021 1616  ? APPEARANCEUR Clear 11/15/2021 1117  ? LABSPEC 1.023 10/20/2021 1616  ? PHURINE 5.0 10/20/2021 1616  ? GLUCOSEU Negative 11/15/2021 1117  ? HGBUR SMALL (A) 10/20/2021 1616  ? BILIRUBINUR Negative 11/15/2021 1117  ? KETONESUR NEGATIVE 10/20/2021 1616  ? PROTEINUR Negative 11/15/2021 1117  ? PROTEINUR 100 (A) 10/20/2021 1616  ? NITRITE Negative  11/15/2021 1117  ? NITRITE NEGATIVE 10/20/2021 1616  ? LEUKOCYTESUR Negative 11/15/2021 1117  ? LEUKOCYTESUR LARGE (A) 10/20/2021 1616  ? ? ?Lab Results  ?Component Value Date  ? LABMICR Comment 11/15/2021  ? BACTERIA RARE (A) 10/20/2021  ? ? ?Pertinent Imaging: ? ?No results found for this or any previous visit. ? ?No results found for this or any previous visit. ? ?No results found for this or any previous visit. ? ?No results found for this or any previous visit. ? ?No  results found for this or any previous visit. ? ?No results found for this or any previous visit. ? ?No results found for this or any previous visit. ? ?No results found for this or any previous visit. ? ?No results found for this or any previous visit (from the past 24 hour(s)).  ?

## 2021-12-09 ENCOUNTER — Encounter: Payer: Self-pay | Admitting: Urology

## 2021-12-09 ENCOUNTER — Ambulatory Visit (INDEPENDENT_AMBULATORY_CARE_PROVIDER_SITE_OTHER): Payer: Medicare Other | Admitting: Physician Assistant

## 2021-12-09 VITALS — BP 108/75 | HR 96 | Ht 78.0 in | Wt 320.0 lb

## 2021-12-09 DIAGNOSIS — N454 Abscess of epididymis or testis: Secondary | ICD-10-CM

## 2021-12-09 NOTE — Patient Instructions (Signed)
Skin Abscess  A skin abscess is an infected area on or under your skin that contains a collection of pus and other material. An abscess may also be called a furuncle,carbuncle, or boil. An abscess can occur in or on almost any part of your body. Some abscesses break open (rupture) on their own. Most continue to get worse unless they are treated. The infection can spread deeper into the body and eventually into your blood, whichcan make you feel ill. Treatment usually involves draining the abscess. What are the causes? An abscess occurs when germs, like bacteria, pass through your skin and cause an infection. This may be caused by: A scrape or cut on your skin. A puncture wound through your skin, including a needle injection or insect bite. Blocked oil or sweat glands. Blocked and infected hair follicles. A cyst that forms beneath your skin (sebaceous cyst) and becomes infected. What increases the risk? This condition is more likely to develop in people who: Have a weak body defense system (immune system). Have diabetes. Have dry and irritated skin. Get frequent injections or use illegal IV drugs. Have a foreign body in a wound, such as a splinter. Have problems with their lymph system or veins. What are the signs or symptoms? Symptoms of this condition include: A painful, firm bump under the skin. A bump with pus at the top. This may break through the skin and drain. Other symptoms include: Redness surrounding the abscess site. Warmth. Swelling of the lymph nodes (glands) near the abscess. Tenderness. A sore on the skin. How is this diagnosed? This condition may be diagnosed based on: A physical exam. Your medical history. A sample of pus. This may be used to find out what is causing the infection. Blood tests. Imaging tests, such as an ultrasound, CT scan, or MRI. How is this treated? A small abscess that drains on its own may not need treatment. Treatment for larger abscesses  may include: Moist heat or heat pack applied to the area several times a day. A procedure to drain the abscess (incision and drainage). Antibiotic medicines. For a severe abscess, you may first get antibiotics through an IV and then change to antibiotics by mouth. Follow these instructions at home: Medicines  Take over-the-counter and prescription medicines only as told by your health care provider. If you were prescribed an antibiotic medicine, take it as told by your health care provider. Do not stop taking the antibiotic even if you start to feel better.  Abscess care  If you have an abscess that has not drained, apply heat to the affected area. Use the heat source that your health care provider recommends, such as a moist heat pack or a heating pad. Place a towel between your skin and the heat source. Leave the heat on for 20-30 minutes. Remove the heat if your skin turns bright red. This is especially important if you are unable to feel pain, heat, or cold. You may have a greater risk of getting burned. Follow instructions from your health care provider about how to take care of your abscess. Make sure you: Cover the abscess with a bandage (dressing). Change your dressing or gauze as told by your health care provider. Wash your hands with soap and water before you change the dressing or gauze. If soap and water are not available, use hand sanitizer. Check your abscess every day for signs of a worsening infection. Check for: More redness, swelling, or pain. More fluid or blood. Warmth. More   pus or a bad smell.  General instructions To avoid spreading the infection: Do not share personal care items, towels, or hot tubs with others. Avoid making skin contact with other people. Keep all follow-up visits as told by your health care provider. This is important. Contact a health care provider if you have: More redness, swelling, or pain around your abscess. More fluid or blood coming  from your abscess. Warm skin around your abscess. More pus or a bad smell coming from your abscess. A fever. Muscle aches. Chills or a general ill feeling. Get help right away if you: Have severe pain. See red streaks on your skin spreading away from the abscess. Summary A skin abscess is an infected area on or under your skin that contains a collection of pus and other material. A small abscess that drains on its own may not need treatment. Treatment for larger abscesses may include having a procedure to drain the abscess and taking an antibiotic. This information is not intended to replace advice given to you by your health care provider. Make sure you discuss any questions you have with your healthcare provider. Document Revised: 12/06/2018 Document Reviewed: 09/28/2017 Elsevier Patient Education  2022 [employer] Inc.  

## 2021-12-09 NOTE — Progress Notes (Signed)
Plan: ?Wound repacked without difficulty today.  We will follow-up in 2 days for repeat packing change. Continue wound care as previously directed ?  ?Chief Complaint: ?No chief complaint on file. ?   ?HPI: ?Ryan Lowe is a 66 y.o. male s/p left orchiectomy 11/19/2021 who presents for continued evaluation of scrotal abscess wound. Patient states he has been doing well since last wound packing change and reports no increase in drainage, foul odor, increase in pain. ?  ?Portions of the above documentation were copied from a prior visit for review purposes only. ?  ?Allergies: ?     ?Allergies  ?Allergen Reactions  ? Tamsulosin Other (See Comments)  ?    Abdominal bloating, nausea, fatigue, dysnea  ?  ?  ?PMH: ?    ?Past Medical History:  ?Diagnosis Date  ? Atrial fibrillation ([facility])    ? Diabetes mellitus without complication ([facility])    ?  ?  ?PSH: ?     ?Past Surgical History:  ?Procedure Laterality Date  ? COLON RESECTION   12/21/2020  ?  removal of right colon  ? ORCHIECTOMY Left 11/19/2021  ?  Procedure: ORCHIECTOMY;  Surgeon: Malen Gauze, MD;  Location: [facility];  Service: Urology;  Laterality: Left;  ? SCROTAL EXPLORATION Left 11/19/2021  ?  Procedure: SCROTUM EXPLORATION- Incision and debridement;  Surgeon: Malen Gauze, MD;  Location: [facility];  Service: Urology;  Laterality: Left;  ?  ?  ?SH: ?Social History  ?  ?     ?Tobacco Use  ? Smoking status: Former  ?    Types: Cigarettes  ? Smokeless tobacco: Former  ?    Types: Chew  ?    Quit date: 08/30/1991  ? Tobacco comments:  ?    Occasional cig mostly dipped.  ?Vaping Use  ? Vaping Use: Never used  ?Substance Use Topics  ? Alcohol use: No  ? Drug use: No  ?  ?  ?ROS: ?Constitutional:  Negative for fever, chills, weight loss ?CV: Negative for chest pain ?Respiratory:  Negative for shortness of breath, wheezing, sleep apnea, frequent cough ?GI:  Negative for nausea, vomiting, bloody stool, GERD ?  ?PE: ?Blood pressure 108/75, pulse 96, height 6\' 6"  (1.981  m), weight (!) 320 lb (145.2 kg).  ?GENERAL APPEARANCE:  Well appearing, well developed, well nourished, NAD ?HEENT:  Atraumatic, normocephalic ?NECK:  Supple. Trachea midline ?ABDOMEN:  Soft, non-tender, no masses ?GU: Left-sided scrotal wound is clean with mild serosanguineous drainage.  Superior aspect of the wound shows prominent Vicryl sutures, but still intact.  The wound exit is mildly erythematous with epithelialization.  No significant tenderness ? ?Procedures  ?Using sterile instruments and strict clean procedure techniques, packing gauze removed from scrotal wound intact and wound is repacked with sterile packing gauze.  Sterile dressing applied over the packing.  Patient tolerated procedure well.  It was performed without complications. ?

## 2021-12-09 NOTE — Progress Notes (Signed)
? ?12/01/2021 ?11:17 AM  ? ?Ryan Lowe ?June 03, 1956 ?[id number] ? ?Referring provider: Ignatius Specking, MD ?[address] ?[place],  [place] [address] ? ?Followup scrotal wound ? ?HPI: ?Ryan Lowe is a 65yo here for followup for packing change. Incision draining scant bloody drainage. NO pain. No fevers. No other complaints today ? ? ?PMH: ?Past Medical History:  ?Diagnosis Date  ? Atrial fibrillation ([facility])   ? Diabetes mellitus without complication ([facility])   ? ? ?Surgical History: ?Past Surgical History:  ?Procedure Laterality Date  ? COLON RESECTION  12/21/2020  ? removal of right colon  ? ORCHIECTOMY Left 11/19/2021  ? Procedure: ORCHIECTOMY;  Surgeon: Ryan Gauze, MD;  Location: [facility];  Service: Urology;  Laterality: Left;  ? SCROTAL EXPLORATION Left 11/19/2021  ? Procedure: SCROTUM EXPLORATION- Incision and debridement;  Surgeon: Ryan Gauze, MD;  Location: [facility];  Service: Urology;  Laterality: Left;  ? ? ?Home Medications:  ?Allergies as of 12/01/2021   ? ?   Reactions  ? Tamsulosin Other (See Comments)  ? Abdominal bloating, nausea, fatigue, dysnea  ? ?  ? ?  ?Medication List  ?  ? ?  ? Accurate as of December 01, 2021 11:59 PM. If you have any questions, ask your nurse or doctor.  ?  ?  ? ?  ? ?aspirin 325 MG tablet ?Take 325 mg by mouth daily. ?  ?clotrimazole-betamethasone cream ?Commonly known as: Lotrisone ?Apply 1 application. topically 2 (two) times daily. ?  ?diltiazem 300 MG 24 hr capsule ?Commonly known as: CARDIZEM CD ?Take 300 mg by mouth daily. ?  ?doxycycline 100 MG tablet ?Commonly known as: VIBRA-TABS ?Take 1 tablet (100 mg total) by mouth 2 (two) times daily. ?  ?fish oil-omega-3 fatty acids 1000 MG capsule ?Take 1 capsule by mouth daily. ?  ?glipiZIDE 5 MG tablet ?Commonly known as: GLUCOTROL ?Take 5 mg by mouth daily. ?  ?One-A-Day Weight Smart Advance Tabs ?Take 1 tablet by mouth daily. ?  ?oxyCODONE-acetaminophen 5-325 MG tablet ?Commonly known as: Percocet ?Take 1 tablet by mouth every 4  (four) hours as needed for severe pain. ?  ?SULFAMETHOXAZOLE-TMP DS PO ?Take 1 tablet by mouth 2 (two) times daily. ?  ? ?  ? ? ?Allergies:  ?Allergies  ?Allergen Reactions  ? Tamsulosin Other (See Comments)  ?  Abdominal bloating, nausea, fatigue, dysnea  ? ? ?Family History: ?Family History  ?Problem Relation Age of Onset  ? Heart disease Other   ? Heart failure Other   ? ? ?Social History:  reports that he has quit smoking. His smoking use included cigarettes. He quit smokeless tobacco use about 30 years ago.  His smokeless tobacco use included chew. He reports that he does not drink alcohol and does not use drugs. ? ?ROS: ?All other review of systems were reviewed and are negative except what is noted above in HPI ? ?Physical Exam: ?BP 109/76   Pulse (!) 101   ?Constitutional:  Alert and oriented, No acute distress. ?HEENT: [place] AT, moist mucus membranes.  Trachea midline, no masses. ?Cardiovascular: No clubbing, cyanosis, or edema. ?Respiratory: Normal respiratory effort, no increased work of breathing. ?GI: Abdomen is soft, nontender, nondistended, no abdominal masses ?GU: No CVA tenderness.  ?Lymph: No cervical or inguinal lymphadenopathy. ?Skin: No rashes, bruises or suspicious lesions. ?Neurologic: Grossly intact, no focal deficits, moving all 4 extremities. ?Psychiatric: Normal mood and affect. ? ?Laboratory Data: ?Lab Results  ?Component Value Date  ? WBC 20.2 (H) 10/20/2021  ?  HGB 13.7 10/20/2021  ? HCT 42.2 10/20/2021  ? MCV 92.1 10/20/2021  ? PLT 195 10/20/2021  ? ? ?Lab Results  ?Component Value Date  ? CREATININE 1.00 10/20/2021  ? ? ?No results found for: PSA ? ?No results found for: TESTOSTERONE ? ?No results found for: HGBA1C ? ?Urinalysis ?   ?Component Value Date/Time  ? COLORURINE YELLOW 10/20/2021 1616  ? APPEARANCEUR Clear 11/15/2021 1117  ? LABSPEC 1.023 10/20/2021 1616  ? PHURINE 5.0 10/20/2021 1616  ? GLUCOSEU Negative 11/15/2021 1117  ? HGBUR SMALL (A) 10/20/2021 1616  ? BILIRUBINUR  Negative 11/15/2021 1117  ? KETONESUR NEGATIVE 10/20/2021 1616  ? PROTEINUR Negative 11/15/2021 1117  ? PROTEINUR 100 (A) 10/20/2021 1616  ? NITRITE Negative 11/15/2021 1117  ? NITRITE NEGATIVE 10/20/2021 1616  ? LEUKOCYTESUR Negative 11/15/2021 1117  ? LEUKOCYTESUR LARGE (A) 10/20/2021 1616  ? ? ?Lab Results  ?Component Value Date  ? LABMICR Comment 11/15/2021  ? BACTERIA RARE (A) 10/20/2021  ? ? ?Pertinent Imaging: ? ?No results found for this or any previous visit. ? ?No results found for this or any previous visit. ? ?No results found for this or any previous visit. ? ?No results found for this or any previous visit. ? ?No results found for this or any previous visit. ? ?No results found for this or any previous visit. ? ?No results found for this or any previous visit. ? ?No results found for this or any previous visit. ? ? ?Assessment & Plan:   ? ?1. Epididymo-orchitis with abscess ?-Scrotal packing changed today. continue packing changes every 2 days ? ? ?No follow-ups on file. ? ?Ryan Aye, MD ? ?[facility] [place] ?  ?

## 2021-12-09 NOTE — Progress Notes (Signed)
? ?12/03/2021 ?11:35 AM  ? ?Ryan Lowe ?Mar 28, 1956 ?[id number] ? ?Referring provider: Ignatius Specking, MD ?[address] ?[place],  [place] [address] ? ?Followup scrotal wound ? ? ?HPI: ?Ryan Lowe is a 65yo here for followup for a left scrotal wound. Packing with scant bloody drainage. No pain. NO fevers. No worsening swelling. ? ? ?PMH: ?Past Medical History:  ?Diagnosis Date  ? Atrial fibrillation (HCC)   ? Diabetes mellitus without complication (HCC)   ? ? ?Surgical History: ?Past Surgical History:  ?Procedure Laterality Date  ? COLON RESECTION  12/21/2020  ? removal of right colon  ? ORCHIECTOMY Left 11/19/2021  ? Procedure: ORCHIECTOMY;  Surgeon: Ryan Gauze, MD;  Location: [facility];  Service: Urology;  Laterality: Left;  ? SCROTAL EXPLORATION Left 11/19/2021  ? Procedure: SCROTUM EXPLORATION- Incision and debridement;  Surgeon: Ryan Gauze, MD;  Location: [facility];  Service: Urology;  Laterality: Left;  ? ? ?Home Medications:  ?Allergies as of 12/03/2021   ? ?   Reactions  ? Tamsulosin Other (See Comments)  ? Abdominal bloating, nausea, fatigue, dysnea  ? ?  ? ?  ?Medication List  ?  ? ?  ? Accurate as of December 03, 2021 11:59 PM. If you have any questions, ask your nurse or doctor.  ?  ?  ? ?  ? ?aspirin 325 MG tablet ?Take 325 mg by mouth daily. ?  ?clotrimazole-betamethasone cream ?Commonly known as: Lotrisone ?Apply 1 application. topically 2 (two) times daily. ?  ?diltiazem 300 MG 24 hr capsule ?Commonly known as: CARDIZEM CD ?Take 300 mg by mouth daily. ?  ?doxycycline 100 MG tablet ?Commonly known as: VIBRA-TABS ?Take 1 tablet (100 mg total) by mouth 2 (two) times daily. ?  ?fish oil-omega-3 fatty acids 1000 MG capsule ?Take 1 capsule by mouth daily. ?  ?glipiZIDE 5 MG tablet ?Commonly known as: GLUCOTROL ?Take 5 mg by mouth daily. ?  ?One-A-Day Weight Smart Advance Tabs ?Take 1 tablet by mouth daily. ?  ?oxyCODONE-acetaminophen 5-325 MG tablet ?Commonly known as: Percocet ?Take 1 tablet by mouth every 4  (four) hours as needed for severe pain. ?  ?SULFAMETHOXAZOLE-TMP DS PO ?Take 1 tablet by mouth 2 (two) times daily. ?  ? ?  ? ? ?Allergies:  ?Allergies  ?Allergen Reactions  ? Tamsulosin Other (See Comments)  ?  Abdominal bloating, nausea, fatigue, dysnea  ? ? ?Family History: ?Family History  ?Problem Relation Age of Onset  ? Heart disease Other   ? Heart failure Other   ? ? ?Social History:  reports that he has quit smoking. His smoking use included cigarettes. He quit smokeless tobacco use about 30 years ago.  His smokeless tobacco use included chew. He reports that he does not drink alcohol and does not use drugs. ? ?ROS: ?All other review of systems were reviewed and are negative except what is noted above in HPI ? ?Physical Exam: ?BP 107/69   Pulse (!) 116   ?Constitutional:  Alert and oriented, No acute distress. ?HEENT: [place] AT, moist mucus membranes.  Trachea midline, no masses. ?Cardiovascular: No clubbing, cyanosis, or edema. ?Respiratory: Normal respiratory effort, no increased work of breathing. ?GI: Abdomen is soft, nontender, nondistended, no abdominal masses ?GU: No CVA tenderness.  ?Lymph: No cervical or inguinal lymphadenopathy. ?Skin: No rashes, bruises or suspicious lesions. ?Neurologic: Grossly intact, no focal deficits, moving all 4 extremities. ?Psychiatric: Normal mood and affect. ? ?Laboratory Data: ?Lab Results  ?Component Value Date  ? WBC 20.2 (  H) 10/20/2021  ? HGB 13.7 10/20/2021  ? HCT 42.2 10/20/2021  ? MCV 92.1 10/20/2021  ? PLT 195 10/20/2021  ? ? ?Lab Results  ?Component Value Date  ? CREATININE 1.00 10/20/2021  ? ? ?No results found for: PSA ? ?No results found for: TESTOSTERONE ? ?No results found for: HGBA1C ? ?Urinalysis ?   ?Component Value Date/Time  ? COLORURINE YELLOW 10/20/2021 1616  ? APPEARANCEUR Clear 11/15/2021 1117  ? LABSPEC 1.023 10/20/2021 1616  ? PHURINE 5.0 10/20/2021 1616  ? GLUCOSEU Negative 11/15/2021 1117  ? HGBUR SMALL (A) 10/20/2021 1616  ? BILIRUBINUR  Negative 11/15/2021 1117  ? KETONESUR NEGATIVE 10/20/2021 1616  ? PROTEINUR Negative 11/15/2021 1117  ? PROTEINUR 100 (A) 10/20/2021 1616  ? NITRITE Negative 11/15/2021 1117  ? NITRITE NEGATIVE 10/20/2021 1616  ? LEUKOCYTESUR Negative 11/15/2021 1117  ? LEUKOCYTESUR LARGE (A) 10/20/2021 1616  ? ? ?Lab Results  ?Component Value Date  ? LABMICR Comment 11/15/2021  ? BACTERIA RARE (A) 10/20/2021  ? ? ?Pertinent Imaging: ? ?No results found for this or any previous visit. ? ?No results found for this or any previous visit. ? ?No results found for this or any previous visit. ? ?No results found for this or any previous visit. ? ?No results found for this or any previous visit. ? ?No results found for this or any previous visit. ? ?No results found for this or any previous visit. ? ?No results found for this or any previous visit. ? ? ?Assessment & Plan:   ? ?1. Epididymo-orchitis with abscess ?RTC Tuesday for dressing/packing change ? ? ?No follow-ups on file. ? ?Ryan Aye, MD ? ?[facility] [place] ?  ?

## 2021-12-13 ENCOUNTER — Other Ambulatory Visit: Payer: Self-pay | Admitting: Urology

## 2021-12-13 ENCOUNTER — Ambulatory Visit (INDEPENDENT_AMBULATORY_CARE_PROVIDER_SITE_OTHER): Payer: Medicare Other | Admitting: Physician Assistant

## 2021-12-13 DIAGNOSIS — N454 Abscess of epididymis or testis: Secondary | ICD-10-CM

## 2021-12-13 MED ORDER — BACITRACIN 500 UNIT/GM EX OINT: 1.0000 "application " | TOPICAL_OINTMENT | Freq: Two times a day (BID) | CUTANEOUS | 0 refills | Status: DC

## 2021-12-13 NOTE — Progress Notes (Signed)
? ?Assessment: ?1. Orchitis, epididymitis, and epididymo-orchitis, with abscess   ? ? ?Plan: ?Prescription for bacitracin sent to the patient's pharmacy.  He will apply to the wound area twice daily and keep covered with dressing as directed.  No additional packing indicated.  He will follow-up later this week for wound check [place]  Continue to keep the area clean and dry, but may now shower,no soaking of the wound.  No swimming. ? ?Chief Complaint: ?No chief complaint on file. ? ? ?HPI: ?Ryan Lowe is a 66 y.o. male who presents for continued evaluation of wound post left orchiectomy on 11/19/2021 with I&D of scrotal abscess.  He states he has been doing well since last dressing change and wound packing.  The amount of drainage is essentially unchanged.  No increase in discomfort. ? ?12/07/21 ?Ryan Lowe is a 66 y.o. male s/p  who presents for continued evaluation of scrotal abscess wound. Patient states he has been doing well since last wound packing change and reports no increase in drainage, foul odor, increase in pain. ?Portions of the above documentation were copied from a prior visit for review purposes only. ? ?Allergies: ?Allergies  ?Allergen Reactions  ? Tamsulosin Other (See Comments)  ?  Abdominal bloating, nausea, fatigue, dysnea  ? ? ?PMH: ?Past Medical History:  ?Diagnosis Date  ? Atrial fibrillation (HCC)   ? Diabetes mellitus without complication (HCC)   ? ? ?PSH: ?Past Surgical History:  ?Procedure Laterality Date  ? COLON RESECTION  12/21/2020  ? removal of right colon  ? ORCHIECTOMY Left 11/19/2021  ? Procedure: ORCHIECTOMY;  Surgeon: Malen Gauze, MD;  Location: [facility];  Service: Urology;  Laterality: Left;  ? SCROTAL EXPLORATION Left 11/19/2021  ? Procedure: SCROTUM EXPLORATION- Incision and debridement;  Surgeon: Malen Gauze, MD;  Location: [facility];  Service: Urology;  Laterality: Left;  ? ? ?SH: ?Social History  ? ?Tobacco Use  ? Smoking  status: Former  ?  Types: Cigarettes  ? Smokeless tobacco: Former  ?  Types: Chew  ?  Quit date: 08/30/1991  ? Tobacco comments:  ?  Occasional cig mostly dipped.  ?Vaping Use  ? Vaping Use: Never used  ?Substance Use Topics  ? Alcohol use: No  ? Drug use: No  ? ? ?ROS: ?Constitutional:  Negative for fever, chills, weight loss ?CV: Negative for chest pain ?Respiratory:  Negative for shortness of breath, wheezing, sleep apnea, frequent cough ?GI:  Negative for nausea, vomiting, bloody stool, GERD ? ?PE: ?There were no vitals taken for this visit. ?GENERAL APPEARANCE:  Well appearing, well developed, well nourished, NAD ?HEENT:  Atraumatic, normocephalic ?NECK:  Supple. Trachea midline ?ABDOMEN:  Soft ?GU: Wound is evaluated with Dr. Ronne Binning.  Packing removed without difficulty and wound depth approximately 1.5 cm at this time.  No surrounding erythema or induration.  Vicryl sutures adjacent to the wound remained intact and unchanged since last exam. ?EXTREMITIES:  Moves all extremities well ?NEUROLOGIC:  Alert and oriented x 3 ?MENTAL STATUS:  appropriate ?SKIN:  Warm, dry, and intact ? ? ?Results: ?Laboratory Data: ?Lab Results  ?Component Value Date  ? WBC 20.2 (H) 10/20/2021  ? HGB 13.7 10/20/2021  ? HCT 42.2 10/20/2021  ? MCV 92.1 10/20/2021  ? PLT 195 10/20/2021  ? ? ?Lab Results  ?Component Value Date  ? CREATININE 1.00 10/20/2021  ? ? ?Urinalysis ?   ?Component Value Date/Time  ? COLORURINE YELLOW 10/20/2021 1616  ?  APPEARANCEUR Clear 11/15/2021 1117  ? LABSPEC 1.023 10/20/2021 1616  ? PHURINE 5.0 10/20/2021 1616  ? GLUCOSEU Negative 11/15/2021 1117  ? HGBUR SMALL (A) 10/20/2021 1616  ? BILIRUBINUR Negative 11/15/2021 1117  ? KETONESUR NEGATIVE 10/20/2021 1616  ? PROTEINUR Negative 11/15/2021 1117  ? PROTEINUR 100 (A) 10/20/2021 1616  ? NITRITE Negative 11/15/2021 1117  ? NITRITE NEGATIVE 10/20/2021 1616  ? LEUKOCYTESUR Negative 11/15/2021 1117  ? LEUKOCYTESUR LARGE (A) 10/20/2021 1616  ? ? ?Lab Results   ?Component Value Date  ? LABMICR Comment 11/15/2021  ? BACTERIA RARE (A) 10/20/2021  ? ? ?Pertinent Imaging: ?No results found for this or any previous visit (from the past 24 hour(s)).  ?

## 2021-12-16 ENCOUNTER — Ambulatory Visit (INDEPENDENT_AMBULATORY_CARE_PROVIDER_SITE_OTHER): Payer: Medicare Other | Admitting: Physician Assistant

## 2021-12-16 DIAGNOSIS — N454 Abscess of epididymis or testis: Secondary | ICD-10-CM

## 2021-12-16 NOTE — Progress Notes (Signed)
? ?Assessment: ?1. Orchitis, epididymitis, and epididymo-orchitis, with abscess   ? ? ?Plan: ?Pt will continue to keep area clean and dry and will cleanse at least 1-2 times/day with shower, soap/water. Apply bacitracin to the sutured area and discussed that the vicryl sutures are absorbable and my pop at some point. No soaking the wound or pool/ocean swimming while on vacay next week. FU for wound check upon his return home in 2 weeks. ? ?Chief Complaint: ?No chief complaint on file. ? ? ?HPI: ?Ryan Lowe is a 66 y.o. male who presents for continued evaluation of scrotal wound post I&D. Drainage remains mild since packing Dcd last visit. No fever, pain, change in sxs.  ? ?12/13/21 ?Ryan Lowe is a 66 y.o. male who presents for continued evaluation of wound post left orchiectomy on 11/19/2021 with I&D of scrotal abscess.  He states he has been doing well since last dressing change and wound packing.  The amount of drainage is essentially unchanged.  No increase in discomfort. ?  ?12/07/21 ?Ryan Lowe is a 66 y.o. male s/p  who presents for continued evaluation of scrotal abscess wound. Patient states he has been doing well since last wound packing change and reports no increase in drainage, foul odor, increase in pain. ?Portions of the above documentation were copied from a prior visit for review purposes only. ? ?Allergies: ?Allergies  ?Allergen Reactions  ? Tamsulosin Other (See Comments)  ?  Abdominal bloating, nausea, fatigue, dysnea  ? ? ?PMH: ?Past Medical History:  ?Diagnosis Date  ? Atrial fibrillation (HCC)   ? Diabetes mellitus without complication (HCC)   ? ? ?PSH: ?Past Surgical History:  ?Procedure Laterality Date  ? COLON RESECTION  12/21/2020  ? removal of right colon  ? ORCHIECTOMY Left 11/19/2021  ? Procedure: ORCHIECTOMY;  Surgeon: Malen Gauze, MD;  Location: [facility];  Service: Urology;  Laterality: Left;  ? SCROTAL EXPLORATION Left 11/19/2021  ? Procedure: SCROTUM EXPLORATION-  Incision and debridement;  Surgeon: Malen Gauze, MD;  Location: [facility];  Service: Urology;  Laterality: Left;  ? ? ?SH: ?Social History  ? ?Tobacco Use  ? Smoking status: Former  ?  Types: Cigarettes  ? Smokeless tobacco: Former  ?  Types: Chew  ?  Quit date: 08/30/1991  ? Tobacco comments:  ?  Occasional cig mostly dipped.  ?Vaping Use  ? Vaping Use: Never used  ?Substance Use Topics  ? Alcohol use: No  ? Drug use: No  ? ? ?ROS: ?Constitutional:  Negative for fever, chills, weight loss ?CV: Negative for chest pain ?Respiratory:  Negative for shortness of breath, wheezing, sleep apnea, frequent cough ?GI:  Negative for nausea, vomiting, bloody stool, GERD ? ?PE: ?There were no vitals taken for this visit. ?GENERAL APPEARANCE:  Well appearing, well developed, well nourished, NAD ?HEENT:  Atraumatic, normocephalic ?NECK:  Supple. Trachea midline ?ABDOMEN:  Soft, non-tender, no masses ?GU: Scrotal incision and wound clean and scant drainage. Vicryl sutures noted and are loose, but still intact. Depth of abscess site now visible, probing only superficially ?MENTAL STATUS:  appropriate ? ?Results: ?Laboratory Data: ?Lab Results  ?Component Value Date  ? WBC 20.2 (H) 10/20/2021  ? HGB 13.7 10/20/2021  ? HCT 42.2 10/20/2021  ? MCV 92.1 10/20/2021  ? PLT 195 10/20/2021  ? ? ?Lab Results  ?Component Value Date  ? CREATININE 1.00 10/20/2021  ? ?Urinalysis ?   ?Component Value Date/Time  ? COLORURINE YELLOW 10/20/2021 1616  ? APPEARANCEUR  Clear 11/15/2021 1117  ? LABSPEC 1.023 10/20/2021 1616  ? PHURINE 5.0 10/20/2021 1616  ? GLUCOSEU Negative 11/15/2021 1117  ? HGBUR SMALL (A) 10/20/2021 1616  ? BILIRUBINUR Negative 11/15/2021 1117  ? KETONESUR NEGATIVE 10/20/2021 1616  ? PROTEINUR Negative 11/15/2021 1117  ? PROTEINUR 100 (A) 10/20/2021 1616  ? NITRITE Negative 11/15/2021 1117  ? NITRITE NEGATIVE 10/20/2021 1616  ? LEUKOCYTESUR Negative 11/15/2021 1117  ? LEUKOCYTESUR LARGE (A) 10/20/2021 1616  ? ? ?Lab Results   ?Component Value Date  ? LABMICR Comment 11/15/2021  ? BACTERIA RARE (A) 10/20/2021  ? ? ?Pertinent Imaging: ? ? ?No results found for this or any previous visit (from the past 24 hour(s)).  ?

## 2021-12-29 ENCOUNTER — Encounter: Payer: Self-pay | Admitting: Urology

## 2021-12-29 ENCOUNTER — Ambulatory Visit (INDEPENDENT_AMBULATORY_CARE_PROVIDER_SITE_OTHER): Payer: Medicare Other | Admitting: Urology

## 2021-12-29 VITALS — BP 111/72 | HR 103

## 2021-12-29 DIAGNOSIS — N454 Abscess of epididymis or testis: Secondary | ICD-10-CM

## 2021-12-29 NOTE — Progress Notes (Signed)
? ?12/29/2021 ?9:41 AM  ? ?Ryan Lowe ?Jan 31, 1956 ?[id number] ? ?Referring provider: Glenda Chroman, MD ?[address] ?[place],  [place] [address] ? ?Scrotal wound ? ?HPI: ?Mr Ryan Lowe is a 66yo here for followup for a scrotal wound after orchiectomy. No drainage form incision. He is applying bacitracin BID to the incision. No fevers. NO pain. No other complaints ? ? ?PMH: ?Past Medical History:  ?Diagnosis Date  ? Atrial fibrillation ([place])   ? Diabetes mellitus without complication ([place])   ? ? ?Surgical History: ?Past Surgical History:  ?Procedure Laterality Date  ? COLON RESECTION  12/21/2020  ? removal of right colon  ? ORCHIECTOMY Left 11/19/2021  ? Procedure: ORCHIECTOMY;  Surgeon: Ryan Gustin, MD;  Location: [facility];  Service: Urology;  Laterality: Left;  ? SCROTAL EXPLORATION Left 11/19/2021  ? Procedure: SCROTUM EXPLORATION- Incision and debridement;  Surgeon: Ryan Gustin, MD;  Location: [facility];  Service: Urology;  Laterality: Left;  ? ? ?Home Medications:  ?Allergies as of 12/29/2021   ? ?   Reactions  ? Tamsulosin Other (See Comments)  ? Abdominal bloating, nausea, fatigue, dysnea  ? ?  ? ?  ?Medication List  ?  ? ?  ? Accurate as of Dec 29, 2021  9:41 AM. If you have any questions, ask your nurse or doctor.  ?  ?  ? ?  ? ?aspirin 325 MG tablet ?Take 325 mg by mouth daily. ?  ?bacitracin 500 UNIT/GM ointment ?Apply 1 application. topically 2 (two) times daily. ?  ?clotrimazole-betamethasone cream ?Commonly known as: Lotrisone ?Apply 1 application. topically 2 (two) times daily. ?  ?diltiazem 300 MG 24 hr capsule ?Commonly known as: CARDIZEM CD ?Take 300 mg by mouth daily. ?  ?doxycycline 100 MG tablet ?Commonly known as: VIBRA-TABS ?TAKE (1) TABLET TWICE DAILY. ?  ?fish oil-omega-3 fatty acids 1000 MG capsule ?Take 1 capsule by mouth daily. ?  ?glipiZIDE 5 MG tablet ?Commonly known as: GLUCOTROL ?Take 5 mg by mouth daily. ?  ?One-A-Day Weight Smart Advance Tabs ?Take 1 tablet by mouth daily. ?   ?oxyCODONE-acetaminophen 5-325 MG tablet ?Commonly known as: Percocet ?Take 1 tablet by mouth every 4 (four) hours as needed for severe pain. ?  ?SULFAMETHOXAZOLE-TMP DS PO ?Take 1 tablet by mouth 2 (two) times daily. ?  ? ?  ? ? ?Allergies:  ?Allergies  ?Allergen Reactions  ? Tamsulosin Other (See Comments)  ?  Abdominal bloating, nausea, fatigue, dysnea  ? ? ?Family History: ?Family History  ?Problem Relation Age of Onset  ? Heart disease Other   ? Heart failure Other   ? ? ?Social History:  reports that he has quit smoking. His smoking use included cigarettes. He quit smokeless tobacco use about 30 years ago.  His smokeless tobacco use included chew. He reports that he does not drink alcohol and does not use drugs. ? ?ROS: ?All other review of systems were reviewed and are negative except what is noted above in HPI ? ?Physical Exam: ?BP 111/72   Pulse (!) 103   ?Constitutional:  Alert and oriented, No acute distress. ?HEENT: [place] AT, moist mucus membranes.  Trachea midline, no masses. ?Cardiovascular: No clubbing, cyanosis, or edema. ?Respiratory: Normal respiratory effort, no increased work of breathing. ?GI: Abdomen is soft, nontender, nondistended, no abdominal masses ?GU: No CVA tenderness. Circumcised phallus. No masses/lesions on penis, testis, scrotum. Healing left hemiscrotum incision ?Lymph: No cervical or inguinal lymphadenopathy. ?Skin: No rashes, bruises or suspicious lesions. ?Neurologic: Grossly intact,  no focal deficits, moving all 4 extremities. ?Psychiatric: Normal mood and affect. ? ?Laboratory Data: ?Lab Results  ?Component Value Date  ? WBC 20.2 (H) 10/20/2021  ? HGB 13.7 10/20/2021  ? HCT 42.2 10/20/2021  ? MCV 92.1 10/20/2021  ? PLT 195 10/20/2021  ? ? ?Lab Results  ?Component Value Date  ? CREATININE 1.00 10/20/2021  ? ? ?No results found for: PSA ? ?No results found for: TESTOSTERONE ? ?No results found for: HGBA1C ? ?Urinalysis ?   ?Component Value Date/Time  ? [place] YELLOW  10/20/2021 1616  ? APPEARANCEUR Clear 11/15/2021 1117  ? LABSPEC 1.023 10/20/2021 1616  ? PHURINE 5.0 10/20/2021 1616  ? GLUCOSEU Negative 11/15/2021 1117  ? HGBUR SMALL (A) 10/20/2021 1616  ? BILIRUBINUR Negative 11/15/2021 1117  ? [place] NEGATIVE 10/20/2021 1616  ? PROTEINUR Negative 11/15/2021 1117  ? PROTEINUR 100 (A) 10/20/2021 1616  ? NITRITE Negative 11/15/2021 1117  ? NITRITE NEGATIVE 10/20/2021 1616  ? LEUKOCYTESUR Negative 11/15/2021 1117  ? LEUKOCYTESUR LARGE (A) 10/20/2021 1616  ? ? ?Lab Results  ?Component Value Date  ? LABMICR Comment 11/15/2021  ? BACTERIA RARE (A) 10/20/2021  ? ? ?Pertinent Imaging: ? ?No results found for this or any previous visit. ? ?No results found for this or any previous visit. ? ?No results found for this or any previous visit. ? ?No results found for this or any previous visit. ? ?No results found for this or any previous visit. ? ?No results found for this or any previous visit. ? ?No results found for this or any previous visit. ? ?No results found for this or any previous visit. ? ? ?Assessment & Plan:   ? ?1. Orchitis, epididymitis, and epididymo-orchitis, with abscess ?-RTC 2 month for a wound check ?-Continue bacitracin BID to wound ? ? ?No follow-ups on file. ? ?Ryan Bang, MD ? ?[facility] ?  ?

## 2021-12-29 NOTE — Patient Instructions (Signed)
Orchitis  Orchitis is inflammation of a testicle. Testicles are the male organs that produce sperm. The testicles are held in a fleshy sac (scrotum) located behind the penis. Orchitis usually affects only one testicle, but it can affect both. Orchitis is caused by infection. Many kinds of bacteria and viruses can cause this infection. The condition can develop suddenly. What are the causes? This condition may be caused by: Infection from viruses or bacteria. Other organisms, such as fungi or parasites. This is rare but can happen in men who have a weak body defense system (immune system), such as men who have HIV. Bacteria  Bacterial orchitis often occurs along with an infection of the tube that collects and stores sperm (epididymis). In men who are not sexually active, this infection usually starts as a urinary tract infection and spreads to the testicle. In sexually active men, sexually transmitted infections (STIs) are the most common cause of bacterial orchitis. These can include: Gonorrhea. Chlamydia. Viruses Mumps is the most common cause of viral orchitis, though mumps is now rare in many areas because of vaccination. Other viruses that can cause orchitis include: The chickenpox virus (varicella-zoster virus). The virus that causes mononucleosis (Epstein-Barr virus). What increases the risk? The following factors may make you more likely to develop this condition: For viral orchitis: Not having been vaccinated against mumps. For bacterial orchitis: Having had frequent urinary tract infections. Engaging in high-risk sexual behaviors, such as having multiple sexual partners or having sex without using a condom. Having a sexual partner with an STI. Having had urinary tract surgery. Using a tube that is passed through the penis to drain urine (Foley catheter). Having an enlarged prostate gland. What are the signs or symptoms? The most common symptoms of orchitis are swelling and  pain in the scrotum. Other signs and symptoms may include: Feeling generally sick (malaise). Fever and chills. Painful urination. Painful ejaculation. Headache. Fatigue. Nausea. Blood or discharge from the penis. Swollen lymph nodes in the groin area (inguinal nodes). How is this diagnosed? This condition may be diagnosed based on: Your symptoms. Your health care provider may suspect orchitis if you have a painful, swollen testicle along with other signs and symptoms of the condition. A physical exam. You may also have other tests, including: A blood test to check for signs of infection. A urine test to check for a urinary tract infection or STI. Using a swab to collect a fluid sample from the tip of the penis to test for STIs. Taking an image of the testicle using sound waves and a computer (testicular ultrasound). How is this treated? Treatment for this condition depends on the cause.  For bacterial orchitis, your health care provider may prescribe antibiotic medicines. Bacterial infections usually clear up within a few days. For both viral infections and bacterial infections, treatment may include: Rest. Anti-inflammatory medicines. Pain medicines. Raising (elevating) the scrotum with a towel or pillow and applying ice. Follow these instructions at home: Managing pain and swelling Elevate your scrotum and apply ice as directed. To do this: Put ice in a plastic bag. Place a small towel or pillow between your legs. Rest your scrotum on the pillow or towel. Place another towel between your skin and the plastic bag. Leave the ice on for 20 minutes, 2-3 times a day. Remove the ice if your skin turns bright red. This is very important. If you cannot feel pain, heat, or cold, you have a greater risk of damage to the area. General instructions   Rest as told by your health care provider. Take over-the-counter and prescription medicines only as told by your health care provider. If you  were prescribed an antibiotic medicine, take it as told by your health care provider. Do not stop taking the antibiotic even if you start to feel better. Do not have sex until your health care provider says it is okay to do so. Keep all follow-up visits. This is important. Contact a health care provider if: You have a fever. Pain and swelling have not gotten better after 3 days. Get help right away if: Your pain is getting worse. The swelling in your testicle gets worse. Summary Orchitis is inflammation of a testicle. It is caused by an infection from bacteria or a virus. The most common symptoms of orchitis are swelling and pain in the scrotum. Treatment for this condition depends on the cause. It may include medicines to fight the infection, reduce inflammation, and relieve the pain. Follow your health care provider's instructions about resting, icing, not having sex, and taking medicines. This information is not intended to replace advice given to you by your health care provider. Make sure you discuss any questions you have with your health care provider. Document Revised: 02/23/2021 Document Reviewed: 02/23/2021 Elsevier Patient Education  2023 [employer]  

## 2022-02-23 ENCOUNTER — Ambulatory Visit (INDEPENDENT_AMBULATORY_CARE_PROVIDER_SITE_OTHER): Payer: Medicare Other | Admitting: Physician Assistant

## 2022-02-23 VITALS — BP 133/77 | HR 105 | Ht 78.0 in | Wt 320.0 lb

## 2022-02-23 DIAGNOSIS — N401 Enlarged prostate with lower urinary tract symptoms: Secondary | ICD-10-CM

## 2022-02-23 DIAGNOSIS — N454 Abscess of epididymis or testis: Secondary | ICD-10-CM | POA: Diagnosis not present

## 2022-02-23 NOTE — Progress Notes (Signed)
Assessment: 1. Epididymo-orchitis with abscess  2. Benign localized prostatic hyperplasia with lower urinary tract symptoms (LUTS)    Plan: No follow-up indicated for wound which is now fully healed.  He needs yearly follow-up for BPH with minimal LUTS and will have him sign medical release from his primary care for PSA history.  Chief Complaint: No chief complaint on file.   HPI: Ryan Lowe is a 66 y.o. male who presents for continued evaluation of postop wound evaluation post abscess after orchiectomy performed on 11/19/2021.  He is doing well without complaints and reports no discomfort, drainage or other urinary complaints today.  Patient states he has BPH evaluation once yearly and would like to transfer care from Dr. Mena Goes to Dr. Ronne Binning because of convenience.  Primary care follows the patient's PSAs  Ryan Lowe is a 65yo here for followup for a scrotal wound after orchiectomy. No drainage form incision. He is applying bacitracin BID to the incision. No fevers. NO pain. No other complaints   Portions of the above documentation were copied from a prior visit for review purposes only.  Allergies: Allergies  Allergen Reactions   Tamsulosin Other (See Comments)    Abdominal bloating, nausea, fatigue, dysnea    PMH: Past Medical History:  Diagnosis Date   Atrial fibrillation (HCC)    Diabetes mellitus without complication (HCC)     PSH: Past Surgical History:  Procedure Laterality Date   COLON RESECTION  12/21/2020   removal of right colon   ORCHIECTOMY Left 11/19/2021   Procedure: ORCHIECTOMY;  Surgeon: Malen Gauze, MD;  Location: [facility];  Service: Urology;  Laterality: Left;   SCROTAL EXPLORATION Left 11/19/2021   Procedure: SCROTUM EXPLORATION- Incision and debridement;  Surgeon: Malen Gauze, MD;  Location: [facility];  Service: Urology;  Laterality: Left;    SH: Social History   Tobacco Use   Smoking status: Former    Types: Cigarettes    Smokeless tobacco: Former    Types: Chew    Quit date: 08/30/1991   Tobacco comments:    Occasional cig mostly dipped.  Vaping Use   Vaping Use: Never used  Substance Use Topics   Alcohol use: No   Drug use: No    ROS: All other review of systems were reviewed and are negative except what is noted above in HPI  PE: BP 133/77   Pulse (!) 105   Ht 6\' 6"  (1.981 m)   Wt (!) 320 lb (145.2 kg)   BMI 36.98 kg/m  GENERAL APPEARANCE:  Well appearing, well developed, well nourished, NAD HEENT:  Atraumatic, normocephalic NECK:  Supple. Trachea midline ABDOMEN:  Soft, non-tender, no masses GU: Normal phallus.  Left-sided scrotal wound has healed fully with some mild puckering noted.  It is nontender there is no induration erythema or fluctuance. NEUROLOGIC:  Alert and oriented x 3 MENTAL STATUS:  appropriate BACK:  Non-tender to palpation, No CVAT SKIN:  Warm, dry, and intact   Results: Laboratory Data: Lab Results  Component Value Date   WBC 20.2 (H) 10/20/2021   HGB 13.7 10/20/2021   HCT 42.2 10/20/2021   MCV 92.1 10/20/2021   PLT 195 10/20/2021    Lab Results  Component Value Date   CREATININE 1.00 10/20/2021    Urinalysis    Component Value Date/Time   COLORURINE YELLOW 10/20/2021 1616   APPEARANCEUR Clear 11/15/2021 1117   LABSPEC 1.023 10/20/2021 1616   PHURINE 5.0 10/20/2021 1616   GLUCOSEU Negative 11/15/2021 1117  HGBUR SMALL (A) 10/20/2021 1616   BILIRUBINUR Negative 11/15/2021 1117   KETONESUR NEGATIVE 10/20/2021 1616   PROTEINUR Negative 11/15/2021 1117   PROTEINUR 100 (A) 10/20/2021 1616   NITRITE Negative 11/15/2021 1117   NITRITE NEGATIVE 10/20/2021 1616   LEUKOCYTESUR Negative 11/15/2021 1117   LEUKOCYTESUR LARGE (A) 10/20/2021 1616    Lab Results  Component Value Date   LABMICR Comment 11/15/2021   BACTERIA RARE (A) 10/20/2021    Pertinent Imaging: No results found for this or any previous visit.  No results found for this or any  previous visit.  No results found for this or any previous visit.  No results found for this or any previous visit.  No results found for this or any previous visit.  No results found for this or any previous visit.  No results found for this or any previous visit.  No results found for this or any previous visit.  No results found for this or any previous visit (from the past 24 hour(s)).

## 2022-03-03 ENCOUNTER — Other Ambulatory Visit: Payer: Self-pay

## 2022-05-13 DIAGNOSIS — I4891 Unspecified atrial fibrillation: Secondary | ICD-10-CM | POA: Diagnosis not present

## 2022-05-13 DIAGNOSIS — Z0000 Encounter for general adult medical examination without abnormal findings: Secondary | ICD-10-CM | POA: Diagnosis not present

## 2022-05-13 DIAGNOSIS — Z299 Encounter for prophylactic measures, unspecified: Secondary | ICD-10-CM | POA: Diagnosis not present

## 2022-05-13 DIAGNOSIS — E7800 Pure hypercholesterolemia, unspecified: Secondary | ICD-10-CM | POA: Diagnosis not present

## 2022-05-13 DIAGNOSIS — Z2821 Immunization not carried out because of patient refusal: Secondary | ICD-10-CM | POA: Diagnosis not present

## 2022-05-13 DIAGNOSIS — Z7189 Other specified counseling: Secondary | ICD-10-CM | POA: Diagnosis not present

## 2022-05-13 DIAGNOSIS — R5383 Other fatigue: Secondary | ICD-10-CM | POA: Diagnosis not present

## 2022-05-13 DIAGNOSIS — Z789 Other specified health status: Secondary | ICD-10-CM | POA: Diagnosis not present

## 2022-05-13 DIAGNOSIS — Z79899 Other long term (current) drug therapy: Secondary | ICD-10-CM | POA: Diagnosis not present

## 2022-05-13 DIAGNOSIS — I471 Supraventricular tachycardia: Secondary | ICD-10-CM | POA: Diagnosis not present

## 2022-06-06 DIAGNOSIS — Z1211 Encounter for screening for malignant neoplasm of colon: Secondary | ICD-10-CM | POA: Diagnosis not present

## 2022-06-06 DIAGNOSIS — Z1212 Encounter for screening for malignant neoplasm of rectum: Secondary | ICD-10-CM | POA: Diagnosis not present

## 2022-06-10 DIAGNOSIS — Z299 Encounter for prophylactic measures, unspecified: Secondary | ICD-10-CM | POA: Diagnosis not present

## 2022-06-10 DIAGNOSIS — J069 Acute upper respiratory infection, unspecified: Secondary | ICD-10-CM | POA: Diagnosis not present

## 2022-06-10 DIAGNOSIS — J309 Allergic rhinitis, unspecified: Secondary | ICD-10-CM | POA: Diagnosis not present

## 2022-06-10 DIAGNOSIS — E1165 Type 2 diabetes mellitus with hyperglycemia: Secondary | ICD-10-CM | POA: Diagnosis not present

## 2022-06-14 ENCOUNTER — Telehealth: Payer: Self-pay

## 2022-06-14 NOTE — Telephone Encounter (Signed)
Patient's wife called with concerns of pt's incision site draining and patient complaints of discomfort.  She would just like to have a provider evaluate his symptoms and the incision site.  Jill's schedule is full, pt added to Dr. Noland Fordyce next available.

## 2022-06-15 ENCOUNTER — Ambulatory Visit (INDEPENDENT_AMBULATORY_CARE_PROVIDER_SITE_OTHER): Payer: Medicare Other | Admitting: Urology

## 2022-06-15 VITALS — BP 127/84 | HR 92

## 2022-06-15 DIAGNOSIS — T8141XA Infection following a procedure, superficial incisional surgical site, initial encounter: Secondary | ICD-10-CM

## 2022-06-15 NOTE — H&P (View-Only) (Signed)
06/15/2022 2:45 PM   ARK AGRUSA June 10, 1956 [id number]  Referring provider: Glenda Chroman, MD [address] [place],  [place] [address]  Drainage from left scrotum   HPI: Mr Ryan Lowe is a 52DP here for evaluation of a left scrotal wound. He underwent left orchiectomy 10/2021 and was doing well until 6 weeks ago when the incision opened and drained purulent material.    PMH: Past Medical History:  Diagnosis Date   Atrial fibrillation ([place])    Diabetes mellitus without complication [facility])     Surgical History: Past Surgical History:  Procedure Laterality Date   COLON RESECTION  12/21/2020   removal of right colon   ORCHIECTOMY Left 11/19/2021   Procedure: ORCHIECTOMY;  Surgeon: Cleon Gustin, MD;  Location: [facility];  Service: Urology;  Laterality: Left;   SCROTAL EXPLORATION Left 11/19/2021   Procedure: SCROTUM EXPLORATION- Incision and debridement;  Surgeon: Cleon Gustin, MD;  Location: [facility];  Service: Urology;  Laterality: Left;    Home Medications:  Allergies as of 06/15/2022       Reactions   Tamsulosin Other (See Comments)   Abdominal bloating, nausea, fatigue, dysnea        Medication List        Accurate as of June 15, 2022  2:45 PM. If you have any questions, ask your nurse or doctor.          aspirin 325 MG tablet Take 325 mg by mouth daily.   bacitracin 500 UNIT/GM ointment Apply 1 application. topically 2 (two) times daily.   clotrimazole-betamethasone cream Commonly known as: Lotrisone Apply 1 application. topically 2 (two) times daily.   diltiazem 300 MG 24 hr capsule Commonly known as: CARDIZEM CD Take 300 mg by mouth daily.   doxycycline 100 MG tablet Commonly known as: VIBRA-TABS TAKE (1) TABLET TWICE DAILY.   fish oil-omega-3 fatty acids 1000 MG capsule Take 1 capsule by mouth daily.   glipiZIDE 5 MG tablet Commonly known as: GLUCOTROL Take 5 mg by mouth daily.   One-A-Day Weight Smart Advance Tabs Take 1 tablet by  mouth daily.   oxyCODONE-acetaminophen 5-325 MG tablet Commonly known as: Percocet Take 1 tablet by mouth every 4 (four) hours as needed for severe pain.   SULFAMETHOXAZOLE-TMP DS PO Take 1 tablet by mouth 2 (two) times daily.        Allergies:  Allergies  Allergen Reactions   Tamsulosin Other (See Comments)    Abdominal bloating, nausea, fatigue, dysnea    Family History: Family History  Problem Relation Age of Onset   Heart disease Other    Heart failure Other     Social History:  reports that he has quit smoking. His smoking use included cigarettes. He quit smokeless tobacco use about 30 years ago.  His smokeless tobacco use included chew. He reports that he does not drink alcohol and does not use drugs.  ROS: All other review of systems were reviewed and are negative except what is noted above in HPI  Physical Exam: BP 127/84   Pulse 92   Constitutional:  Alert and oriented, No acute distress. HEENT: [place] AT, moist mucus membranes.  Trachea midline, no masses. Cardiovascular: No clubbing, cyanosis, or edema. Respiratory: Normal respiratory effort, no increased work of breathing. GI: Abdomen is soft, nontender, nondistended, no abdominal masses GU: No CVA tenderness. Circumcised phallus. No masses/lesions on penis, testis, scrotum. Left superior scrotum abscess with purulent drainage Skin: No rashes, bruises or suspicious lesions. Neurologic: Grossly  intact, no focal deficits, moving all 4 extremities. Psychiatric: Normal mood and affect.  Laboratory Data: Lab Results  Component Value Date   WBC 20.2 (H) 10/20/2021   HGB 13.7 10/20/2021   HCT 42.2 10/20/2021   MCV 92.1 10/20/2021   PLT 195 10/20/2021    Lab Results  Component Value Date   CREATININE 1.00 10/20/2021    No results found for: "PSA"  No results found for: "TESTOSTERONE"  No results found for: "HGBA1C"  Urinalysis    Component Value Date/Time   COLORURINE YELLOW 10/20/2021 1616    APPEARANCEUR Clear 11/15/2021 1117   LABSPEC 1.023 10/20/2021 1616   PHURINE 5.0 10/20/2021 1616   GLUCOSEU Negative 11/15/2021 1117   HGBUR SMALL (A) 10/20/2021 1616   BILIRUBINUR Negative 11/15/2021 1117   KETONESUR NEGATIVE 10/20/2021 1616   PROTEINUR Negative 11/15/2021 1117   PROTEINUR 100 (A) 10/20/2021 1616   NITRITE Negative 11/15/2021 1117   NITRITE NEGATIVE 10/20/2021 1616   LEUKOCYTESUR Negative 11/15/2021 1117   LEUKOCYTESUR LARGE (A) 10/20/2021 1616    Lab Results  Component Value Date   LABMICR Comment 11/15/2021   BACTERIA RARE (A) 10/20/2021    Pertinent Imaging:  No results found for this or any previous visit.  No results found for this or any previous visit.  No results found for this or any previous visit.  No results found for this or any previous visit.  No results found for this or any previous visit.  No valid procedures specified. No results found for this or any previous visit.  No results found for this or any previous visit.   Assessment & Plan:    1. Infection involving stitch with abscess We discussed the management of scrotal abscess including scrotal exploration and incision and drainage and after discussing the treatment option the patient wishes to proceed with surgery   No follow-ups on file.  Wilkie Aye, MD  [facility] [place]

## 2022-06-15 NOTE — Progress Notes (Signed)
06/15/2022 2:45 PM   Ryan Lowe June 10, 1956 [id number]  Referring provider: Glenda Chroman, MD [address] [place],  [place] [address]  Drainage from left scrotum   HPI: Ryan Lowe is a 52DP here for evaluation of a left scrotal wound. He underwent left orchiectomy 10/2021 and was doing well until 6 weeks ago when the incision opened and drained purulent material.    PMH: Past Medical History:  Diagnosis Date   Atrial fibrillation ([place])    Diabetes mellitus without complication [facility])     Surgical History: Past Surgical History:  Procedure Laterality Date   COLON RESECTION  12/21/2020   removal of right colon   ORCHIECTOMY Left 11/19/2021   Procedure: ORCHIECTOMY;  Surgeon: Cleon Gustin, MD;  Location: [facility];  Service: Urology;  Laterality: Left;   SCROTAL EXPLORATION Left 11/19/2021   Procedure: SCROTUM EXPLORATION- Incision and debridement;  Surgeon: Cleon Gustin, MD;  Location: [facility];  Service: Urology;  Laterality: Left;    Home Medications:  Allergies as of 06/15/2022       Reactions   Tamsulosin Other (See Comments)   Abdominal bloating, nausea, fatigue, dysnea        Medication List        Accurate as of June 15, 2022  2:45 PM. If you have any questions, ask your nurse or doctor.          aspirin 325 MG tablet Take 325 mg by mouth daily.   bacitracin 500 UNIT/GM ointment Apply 1 application. topically 2 (two) times daily.   clotrimazole-betamethasone cream Commonly known as: Lotrisone Apply 1 application. topically 2 (two) times daily.   diltiazem 300 MG 24 hr capsule Commonly known as: CARDIZEM CD Take 300 mg by mouth daily.   doxycycline 100 MG tablet Commonly known as: VIBRA-TABS TAKE (1) TABLET TWICE DAILY.   fish oil-omega-3 fatty acids 1000 MG capsule Take 1 capsule by mouth daily.   glipiZIDE 5 MG tablet Commonly known as: GLUCOTROL Take 5 mg by mouth daily.   One-A-Day Weight Smart Advance Tabs Take 1 tablet by  mouth daily.   oxyCODONE-acetaminophen 5-325 MG tablet Commonly known as: Percocet Take 1 tablet by mouth every 4 (four) hours as needed for severe pain.   SULFAMETHOXAZOLE-TMP DS PO Take 1 tablet by mouth 2 (two) times daily.        Allergies:  Allergies  Allergen Reactions   Tamsulosin Other (See Comments)    Abdominal bloating, nausea, fatigue, dysnea    Family History: Family History  Problem Relation Age of Onset   Heart disease Other    Heart failure Other     Social History:  reports that he has quit smoking. His smoking use included cigarettes. He quit smokeless tobacco use about 30 years ago.  His smokeless tobacco use included chew. He reports that he does not drink alcohol and does not use drugs.  ROS: All other review of systems were reviewed and are negative except what is noted above in HPI  Physical Exam: BP 127/84   Pulse 92   Constitutional:  Alert and oriented, No acute distress. HEENT: [place] AT, moist mucus membranes.  Trachea midline, no masses. Cardiovascular: No clubbing, cyanosis, or edema. Respiratory: Normal respiratory effort, no increased work of breathing. GI: Abdomen is soft, nontender, nondistended, no abdominal masses GU: No CVA tenderness. Circumcised phallus. No masses/lesions on penis, testis, scrotum. Left superior scrotum abscess with purulent drainage Skin: No rashes, bruises or suspicious lesions. Neurologic: Grossly  intact, no focal deficits, moving all 4 extremities. Psychiatric: Normal mood and affect.  Laboratory Data: Lab Results  Component Value Date   WBC 20.2 (H) 10/20/2021   HGB 13.7 10/20/2021   HCT 42.2 10/20/2021   MCV 92.1 10/20/2021   PLT 195 10/20/2021    Lab Results  Component Value Date   CREATININE 1.00 10/20/2021    No results found for: "PSA"  No results found for: "TESTOSTERONE"  No results found for: "HGBA1C"  Urinalysis    Component Value Date/Time   COLORURINE YELLOW 10/20/2021 1616    APPEARANCEUR Clear 11/15/2021 1117   LABSPEC 1.023 10/20/2021 1616   PHURINE 5.0 10/20/2021 1616   GLUCOSEU Negative 11/15/2021 1117   HGBUR SMALL (A) 10/20/2021 1616   BILIRUBINUR Negative 11/15/2021 1117   KETONESUR NEGATIVE 10/20/2021 1616   PROTEINUR Negative 11/15/2021 1117   PROTEINUR 100 (A) 10/20/2021 1616   NITRITE Negative 11/15/2021 1117   NITRITE NEGATIVE 10/20/2021 1616   LEUKOCYTESUR Negative 11/15/2021 1117   LEUKOCYTESUR LARGE (A) 10/20/2021 1616    Lab Results  Component Value Date   LABMICR Comment 11/15/2021   BACTERIA RARE (A) 10/20/2021    Pertinent Imaging:  No results found for this or any previous visit.  No results found for this or any previous visit.  No results found for this or any previous visit.  No results found for this or any previous visit.  No results found for this or any previous visit.  No valid procedures specified. No results found for this or any previous visit.  No results found for this or any previous visit.   Assessment & Plan:    1. Infection involving stitch with abscess We discussed the management of scrotal abscess including scrotal exploration and incision and drainage and after discussing the treatment option the patient wishes to proceed with surgery   No follow-ups on file.  Wilkie Aye, MD  [facility] [place]

## 2022-06-15 NOTE — Progress Notes (Signed)
I spoke with Mr. Cozort. We have discussed possible surgery dates and 06/16/2022 was agreed upon by all parties. Patient given information about surgery date, what to expect pre-operatively and post operatively.    We discussed that a pre-op nurse will be calling to set up the pre-op visit that will take place prior to surgery. Informed patient that our office will communicate any additional care to be provided after surgery.    Patients questions or concerns were discussed during our call. Advised to call our office should there be any additional information, questions or concerns that arise. Patient verbalized understanding.

## 2022-06-16 ENCOUNTER — Encounter (HOSPITAL_COMMUNITY)
Admission: RE | Admit: 2022-06-16 | Discharge: 2022-06-16 | Disposition: A | Discharge status: Home / Self Care | Payer: Medicare Other | Source: Ambulatory Visit | Attending: Urology | Admitting: Urology

## 2022-06-16 ENCOUNTER — Encounter (HOSPITAL_COMMUNITY): Payer: Self-pay

## 2022-06-16 VITALS — BP 130/86 | HR 95 | Temp 97.8°F | Resp 18 | Ht 78.0 in | Wt 320.1 lb

## 2022-06-16 DIAGNOSIS — I482 Chronic atrial fibrillation, unspecified: Secondary | ICD-10-CM | POA: Diagnosis not present

## 2022-06-16 DIAGNOSIS — Z01818 Encounter for other preprocedural examination: Secondary | ICD-10-CM | POA: Insufficient documentation

## 2022-06-16 DIAGNOSIS — E0800 Diabetes mellitus due to underlying condition with hyperosmolarity without nonketotic hyperglycemic-hyperosmolar coma (NKHHC): Secondary | ICD-10-CM | POA: Insufficient documentation

## 2022-06-16 DIAGNOSIS — I4819 Other persistent atrial fibrillation: Secondary | ICD-10-CM

## 2022-06-16 HISTORY — DX: Cardiac arrhythmia, unspecified: I49.9

## 2022-06-16 HISTORY — DX: Other specified postprocedural states: Z98.890

## 2022-06-16 LAB — BASIC METABOLIC PANEL
Anion gap: 8 (ref 5–15)
BUN: 15 mg/dL (ref 8–23)
CO2: 24 mmol/L (ref 22–32)
Calcium: 9.4 mg/dL (ref 8.9–10.3)
Chloride: 106 mmol/L (ref 98–111)
Creatinine, Ser: 0.95 mg/dL (ref 0.61–1.24)
GFR, Estimated: >60 mL/min (ref >60)
Glucose, Bld: 105 mg/dL — ABNORMAL HIGH (ref 70–99)
Potassium: 3.8 mmol/L (ref 3.5–5.1)
Sodium: 138 mmol/L (ref 135–145)

## 2022-06-16 LAB — HEMOGLOBIN A1C
Hgb A1c MFr Bld: 6.4 % — ABNORMAL HIGH (ref 4.8–5.6)
Mean Plasma Glucose: 136.98 mg/dL

## 2022-06-16 NOTE — Patient Instructions (Signed)
Ryan Lowe  06/16/2022     @PREFPERIOPPHARMACY @   Your procedure is scheduled on Friday, 06/17/22.  Report to [facility] at [phone] A.M.  Call this number if you have problems the morning of surgery:  [phone]  If you experience any cold or flu symptoms such as cough, fever, chills, shortness of breath, etc. between now and your scheduled surgery, please notify us at the above number.   Remember:  Do not eat or drink after midnight.      Take these medicines the morning of surgery with A SIP OF WATER cardizem     Do not wear jewelry, make-up or nail polish.  Do not wear lotions, powders, or perfumes, or deodorant.  Do not shave 48 hours prior to surgery.  Men may shave face and neck.  Do not bring valuables to the hospital.  [facility] is not responsible for any belongings or valuables.  Contacts, dentures or bridgework may not be worn into surgery.  Leave your suitcase in the car.  After surgery it may be brought to your room.  For patients admitted to the hospital, discharge time will be determined by your treatment team.  Patients discharged the day of surgery will not be allowed to drive home.   Name and phone number of your driver:   family  Special instructions:    Please read over the following fact sheets that you were given. Coughing and Deep Breathing, Surgical Site Infection Prevention, Anesthesia Post-op Instructions, and Care and Recovery After Surgery      Incision and Drainage Incision and drainage is a surgical procedure to open and drain a fluid-filled sac. The sac may be filled with pus, mucus, or blood. Examples of fluid-filled sacs that may need surgical drainage include cysts, skin infections (abscesses), and red lumps that develop from a ruptured cyst or a small abscess (boils). You may need this procedure if the affected area is large, painful, infected, or not healing well. Tell a health care provider about: Any allergies you have. All  medicines you are taking, including vitamins, herbs, eye drops, creams, and over-the-counter medicines. Any problems you or family members have had with anesthetic medicines. Any blood disorders you have or have had. Any surgeries you have had. Any medical conditions you have or have had. Whether you are pregnant or may be pregnant. What are the risks? Generally, this is a safe procedure. However, problems may occur, including: Infection. Bleeding. Allergic reactions to medicines. Scarring. The cyst or abscess returns. Damage to nerves or vessels. What happens before the procedure? Medicine Ask your health care provider about: Changing or stopping your regular medicines. This is especially important if you are taking diabetes medicines or blood thinners. Taking medicines such as aspirin and ibuprofen. These medicines can thin your blood. Do not take these medicines unless your health care provider tells you to take them. Taking over-the-counter medicines, vitamins, herbs, and supplements. Tests You may have an exam or testing. These may include: Ultrasound or other imaging tests to see how large or deep the fluid-filled sac is. Blood tests to check for infection. General instructions Follow instructions from your health care provider about eating or drinking restrictions. Plan to have someone take you home from the hospital or clinic. Ask your health care provider whether a responsible adult should care for you for at least 24 hours after you leave the hospital or clinic. This is important. You may get a tetanus shot. Ask your health care  provider: How your surgery site will be marked or identified. What steps will be taken to help prevent infection. These may include: Removing hair at the surgery site. Washing skin with a germ-killing soap. Receiving antibiotic medicine. What happens during the procedure?  An IV may be inserted into one of your veins. You will be given one or  more of the following: A medicine to help you relax (sedative). A medicine to numb the area (local anesthetic). A medicine to make you fall asleep (general anesthetic). An incision will be made in the top of the fluid-filled sac. Pus, blood, and mucus will be squeezed out, and a syringe or tube (drain) may be used to empty more fluid from the sac. Your health care provider will do one of the following. He or she may: Leave the drain in place for several weeks to drain more fluid. Stitch open the edges of the incision to make a long-term opening for drainage (marsupialization). The inside of the sac may be washed out (irrigated) with a sterile solution and packed with gauze before it is covered with a bandage (dressing). Your health care provider may do a culture test of the drainage fluid. The procedure may vary among health care providers and hospitals. What happens after the procedure? Your blood pressure, heart rate, breathing rate, and blood oxygen level will be monitored often until you leave the hospital or clinic. Do not drive for 24 hours if you were given a sedative during your procedure. Summary Incision and drainage is a surgical procedure to open and drain a fluid-filled sac. The sac may be filled with pus, mucus, or blood. Before the procedure, you may be given antibiotic medicine to treat or help prevent infection. During the procedure, an incision will be made in the top of the fluid-filled sac. Pus, blood, and mucus is squeezed out, and a syringe or tube (drain) may be used to empty more fluid from the sac. The inside of the sac may be washed out (irrigated) with a sterile solution and packed with gauze before it is covered with a bandage (dressing). This information is not intended to replace advice given to you by your health care provider. Make sure you discuss any questions you have with your health care provider. Document Revised: 11/18/2021 Document Reviewed:  05/27/2021 Elsevier Patient Education  [place] Anesthesia, Adult, Care After The following information offers guidance on how to care for yourself after your procedure. Your health care provider may also give you more specific instructions. If you have problems or questions, contact your health care provider. What can I expect after the procedure? After the procedure, it is common for people to: Have pain or discomfort at the IV site. Have nausea or vomiting. Have a sore throat or hoarseness. Have trouble concentrating. Feel cold or chills. Feel weak, sleepy, or tired (fatigue). Have soreness and body aches. These can affect parts of the body that were not involved in surgery. Follow these instructions at home: For the time period you were told by your health care provider:  Rest. Do not participate in activities where you could fall or become injured. Do not drive or use machinery. Do not drink alcohol. Do not take sleeping pills or medicines that cause drowsiness. Do not make important decisions or sign legal documents. Do not take care of children on your own. General instructions Drink enough fluid to keep your urine pale yellow. If you have sleep apnea, surgery and certain medicines can increase  your risk for breathing problems. Follow instructions from your health care provider about wearing your sleep device: Anytime you are sleeping, including during daytime naps. While taking prescription pain medicines, sleeping medicines, or medicines that make you drowsy. Return to your normal activities as told by your health care provider. Ask your health care provider what activities are safe for you. Take over-the-counter and prescription medicines only as told by your health care provider. Do not use any products that contain nicotine or tobacco. These products include cigarettes, chewing tobacco, and vaping devices, such as e-cigarettes. These can delay incision  healing after surgery. If you need help quitting, ask your health care provider. Contact a health care provider if: You have nausea or vomiting that does not get better with medicine. You vomit every time you eat or drink. You have pain that does not get better with medicine. You cannot urinate or have bloody urine. You develop a skin rash. You have a fever. Get help right away if: You have trouble breathing. You have chest pain. You vomit blood. These symptoms may be an emergency. Get help right away. Call 911. Do not wait to see if the symptoms will go away. Do not drive yourself to the hospital. Summary After the procedure, it is common to have a sore throat, hoarseness, nausea, vomiting, or to feel weak, sleepy, or fatigue. For the time period you were told by your health care provider, do not drive or use machinery. Get help right away if you have difficulty breathing, have chest pain, or vomit blood. These symptoms may be an emergency. This information is not intended to replace advice given to you by your health care provider. Make sure you discuss any questions you have with your health care provider. Document Revised: 11/12/2021 Document Reviewed: 11/12/2021 Elsevier Patient Education  [place].

## 2022-06-17 ENCOUNTER — Ambulatory Visit (HOSPITAL_COMMUNITY): Payer: Medicare Other | Admitting: Anesthesiology

## 2022-06-17 ENCOUNTER — Encounter (HOSPITAL_COMMUNITY)
Admission: RE | Disposition: A | Discharge status: Home / Self Care | Payer: Self-pay | Source: Home / Self Care | Attending: Urology

## 2022-06-17 ENCOUNTER — Other Ambulatory Visit: Payer: Self-pay | Admitting: Urology

## 2022-06-17 ENCOUNTER — Other Ambulatory Visit: Payer: Self-pay

## 2022-06-17 ENCOUNTER — Ambulatory Visit (HOSPITAL_BASED_OUTPATIENT_CLINIC_OR_DEPARTMENT_OTHER): Payer: Medicare Other | Admitting: Anesthesiology

## 2022-06-17 ENCOUNTER — Encounter (HOSPITAL_COMMUNITY): Payer: Self-pay | Admitting: Urology

## 2022-06-17 ENCOUNTER — Ambulatory Visit (HOSPITAL_COMMUNITY)
Admission: RE | Admit: 2022-06-17 | Discharge: 2022-06-17 | Disposition: A | Discharge status: Home / Self Care | Payer: Medicare Other | Attending: Urology | Admitting: Urology

## 2022-06-17 DIAGNOSIS — I4891 Unspecified atrial fibrillation: Secondary | ICD-10-CM | POA: Insufficient documentation

## 2022-06-17 DIAGNOSIS — Z87891 Personal history of nicotine dependence: Secondary | ICD-10-CM | POA: Insufficient documentation

## 2022-06-17 DIAGNOSIS — S3130XA Unspecified open wound of scrotum and testes, initial encounter: Secondary | ICD-10-CM | POA: Diagnosis not present

## 2022-06-17 DIAGNOSIS — X58XXXA Exposure to other specified factors, initial encounter: Secondary | ICD-10-CM | POA: Diagnosis not present

## 2022-06-17 DIAGNOSIS — S3094XA Unspecified superficial injury of scrotum and testes, initial encounter: Secondary | ICD-10-CM | POA: Diagnosis present

## 2022-06-17 DIAGNOSIS — E119 Type 2 diabetes mellitus without complications: Secondary | ICD-10-CM | POA: Insufficient documentation

## 2022-06-17 DIAGNOSIS — T8143XA Infection following a procedure, organ and space surgical site, initial encounter: Secondary | ICD-10-CM | POA: Diagnosis not present

## 2022-06-17 DIAGNOSIS — N492 Inflammatory disorders of scrotum: Secondary | ICD-10-CM

## 2022-06-17 HISTORY — PX: INCISION AND DRAINAGE ABSCESS: SHX5864

## 2022-06-17 HISTORY — PX: SCROTAL EXPLORATION: SHX2386

## 2022-06-17 LAB — GLUCOSE, CAPILLARY
Glucose-Capillary: 157 mg/dL — ABNORMAL HIGH (ref 70–99)
Glucose-Capillary: 167 mg/dL — ABNORMAL HIGH (ref 70–99)

## 2022-06-17 SURGERY — EXPLORATION, SCROTUM
Anesthesia: General | Site: Scrotum

## 2022-06-17 MED ORDER — FENTANYL CITRATE PF 50 MCG/ML IJ SOSY: 25.0000 ug | PREFILLED_SYRINGE | INTRAMUSCULAR | Status: DC | PRN

## 2022-06-17 MED ORDER — ORAL CARE MOUTH RINSE: 15.0000 mL | Freq: Once | OROMUCOSAL | Status: DC

## 2022-06-17 MED ORDER — ONDANSETRON HCL 4 MG/2ML IJ SOLN
INTRAMUSCULAR | Status: DC | PRN
  Administered 2022-06-17: 4 mg via INTRAVENOUS

## 2022-06-17 MED ORDER — CEFAZOLIN IN SODIUM CHLORIDE 3-0.9 GM/100ML-% IV SOLN
3.0000 g | INTRAVENOUS | Status: AC
  Administered 2022-06-17: 3 g via INTRAVENOUS

## 2022-06-17 MED ORDER — CHLORHEXIDINE GLUCONATE 0.12 % MT SOLN: 15.0000 mL | Freq: Once | OROMUCOSAL | Status: DC

## 2022-06-17 MED ORDER — ONDANSETRON HCL 4 MG/2ML IJ SOLN: 4.0000 mg | Freq: Once | INTRAMUSCULAR | Status: DC | PRN

## 2022-06-17 MED ORDER — MIDAZOLAM HCL 2 MG/2ML IJ SOLN
INTRAMUSCULAR | Status: AC
  Filled 2022-06-17: qty 2

## 2022-06-17 MED ORDER — FENTANYL CITRATE (PF) 100 MCG/2ML IJ SOLN
INTRAMUSCULAR | Status: DC | PRN
  Administered 2022-06-17 (×2): 50 ug via INTRAVENOUS

## 2022-06-17 MED ORDER — DEXAMETHASONE SODIUM PHOSPHATE 10 MG/ML IJ SOLN
INTRAMUSCULAR | Status: AC
  Filled 2022-06-17: qty 1

## 2022-06-17 MED ORDER — LACTATED RINGERS IV SOLN: INTRAVENOUS | Status: DC

## 2022-06-17 MED ORDER — BACITRACIN ZINC 500 UNIT/GM EX OINT
TOPICAL_OINTMENT | CUTANEOUS | Status: DC | PRN
  Administered 2022-06-17: 1 via TOPICAL

## 2022-06-17 MED ORDER — EPHEDRINE SULFATE (PRESSORS) 50 MG/ML IJ SOLN
INTRAMUSCULAR | Status: DC | PRN
  Administered 2022-06-17: 10 mg via INTRAVENOUS

## 2022-06-17 MED ORDER — PROPOFOL 10 MG/ML IV BOLUS
INTRAVENOUS | Status: AC
  Filled 2022-06-17: qty 20

## 2022-06-17 MED ORDER — 0.9 % SODIUM CHLORIDE (POUR BTL) OPTIME
TOPICAL | Status: DC | PRN
  Administered 2022-06-17: 1000 mL

## 2022-06-17 MED ORDER — BUPIVACAINE HCL (PF) 0.25 % IJ SOLN
INTRAMUSCULAR | Status: AC
  Filled 2022-06-17: qty 30

## 2022-06-17 MED ORDER — OXYCODONE-ACETAMINOPHEN 7.5-325 MG PO TABS: 1.0000 | ORAL_TABLET | ORAL | 0 refills | Status: DC | PRN

## 2022-06-17 MED ORDER — DEXAMETHASONE SODIUM PHOSPHATE 4 MG/ML IJ SOLN
INTRAMUSCULAR | Status: DC | PRN
  Administered 2022-06-17: 4 mg via INTRAVENOUS

## 2022-06-17 MED ORDER — OXYCODONE-ACETAMINOPHEN 5-325 MG PO TABS: 1.0000 | ORAL_TABLET | ORAL | 0 refills | Status: AC | PRN

## 2022-06-17 MED ORDER — OXYCODONE HCL 5 MG/5ML PO SOLN: 5.0000 mg | Freq: Once | ORAL | Status: DC | PRN

## 2022-06-17 MED ORDER — BACITRACIN ZINC 500 UNIT/GM EX OINT
TOPICAL_OINTMENT | CUTANEOUS | Status: AC
  Filled 2022-06-17: qty 0.9

## 2022-06-17 MED ORDER — PROPOFOL 10 MG/ML IV BOLUS
INTRAVENOUS | Status: DC | PRN
  Administered 2022-06-17: 200 mg via INTRAVENOUS

## 2022-06-17 MED ORDER — SULFAMETHOXAZOLE-TRIMETHOPRIM 800-160 MG PO TABS: 1.0000 | ORAL_TABLET | Freq: Two times a day (BID) | ORAL | 0 refills | Status: DC

## 2022-06-17 MED ORDER — OXYCODONE HCL 5 MG PO TABS: 5.0000 mg | ORAL_TABLET | Freq: Once | ORAL | Status: DC | PRN

## 2022-06-17 MED ORDER — ONDANSETRON HCL 4 MG/2ML IJ SOLN
INTRAMUSCULAR | Status: AC
  Filled 2022-06-17: qty 2

## 2022-06-17 MED ORDER — LIDOCAINE HCL (PF) 2 % IJ SOLN
INTRAMUSCULAR | Status: AC
  Filled 2022-06-17: qty 5

## 2022-06-17 MED ORDER — MIDAZOLAM HCL 5 MG/5ML IJ SOLN
INTRAMUSCULAR | Status: DC | PRN
  Administered 2022-06-17: 2 mg via INTRAVENOUS

## 2022-06-17 MED ORDER — PHENYLEPHRINE 80 MCG/ML (10ML) SYRINGE FOR IV PUSH (FOR BLOOD PRESSURE SUPPORT)
PREFILLED_SYRINGE | INTRAVENOUS | Status: AC
  Filled 2022-06-17: qty 20

## 2022-06-17 MED ORDER — PHENYLEPHRINE HCL (PRESSORS) 10 MG/ML IV SOLN
INTRAVENOUS | Status: DC | PRN
  Administered 2022-06-17 (×5): 160 ug via INTRAVENOUS

## 2022-06-17 MED ORDER — EPHEDRINE 5 MG/ML INJ
INTRAVENOUS | Status: AC
  Filled 2022-06-17: qty 5

## 2022-06-17 MED ORDER — FENTANYL CITRATE (PF) 100 MCG/2ML IJ SOLN
INTRAMUSCULAR | Status: AC
  Filled 2022-06-17: qty 2

## 2022-06-17 SURGICAL SUPPLY — 28 items
BNDG GAUZE DERMACEA FLUFF 4 (GAUZE/BANDAGES/DRESSINGS) IMPLANT
COVER LIGHT HANDLE STERIS (MISCELLANEOUS) ×2 IMPLANT
ELECT NDL TIP 2.8 STRL (NEEDLE) IMPLANT
ELECT NEEDLE TIP 2.8 STRL (NEEDLE) ×1 IMPLANT
ELECT REM PT RETURN 9FT ADLT (ELECTROSURGICAL) ×1
ELECTRODE REM PT RTRN 9FT ADLT (ELECTROSURGICAL) ×1 IMPLANT
GAUZE PACKING IODOFORM 1/2INX (GAUZE/BANDAGES/DRESSINGS) IMPLANT
GAUZE SPONGE 4X4 12PLY STRL (GAUZE/BANDAGES/DRESSINGS) ×1 IMPLANT
GLOVE BIO SURGEON STRL SZ7 (GLOVE) IMPLANT
GLOVE BIO SURGEON STRL SZ8 (GLOVE) ×1 IMPLANT
GLOVE BIOGEL PI IND STRL 7.0 (GLOVE) ×2 IMPLANT
GLOVE BIOGEL PI IND STRL 8 (GLOVE) ×1 IMPLANT
GOWN STRL REUS W/TWL LRG LVL3 (GOWN DISPOSABLE) ×1 IMPLANT
GOWN STRL REUS W/TWL XL LVL3 (GOWN DISPOSABLE) ×1 IMPLANT
KIT TURNOVER KIT A (KITS) ×1 IMPLANT
MANIFOLD NEPTUNE II (INSTRUMENTS) ×1 IMPLANT
NDL HYPO 25X1 1.5 SAFETY (NEEDLE) ×1 IMPLANT
NEEDLE HYPO 25X1 1.5 SAFETY (NEEDLE) ×1 IMPLANT
PACK MINOR (CUSTOM PROCEDURE TRAY) ×1 IMPLANT
PAD ARMBOARD 7.5X6 YLW CONV (MISCELLANEOUS) ×1 IMPLANT
SET BASIN LINEN APH (SET/KITS/TRAYS/PACK) ×1 IMPLANT
SOL PREP POV-IOD 4OZ 10% (MISCELLANEOUS) ×1 IMPLANT
SOL PREP PROV IODINE SCRUB 4OZ (MISCELLANEOUS) ×1 IMPLANT
SUPPORT SCROTAL LG STRP (MISCELLANEOUS) ×1 IMPLANT
SUT VIC AB 2-0 CT2 27 (SUTURE) IMPLANT
SUT VIC AB 3-0 SH 27 (SUTURE) ×1
SUT VIC AB 3-0 SH 27X BRD (SUTURE) IMPLANT
SYR CONTROL 10ML LL (SYRINGE) ×1 IMPLANT

## 2022-06-17 NOTE — Brief Op Note (Signed)
06/17/2022  8:31 AM  PATIENT:  Ryan Lowe  66 y.o. male  PRE-OPERATIVE DIAGNOSIS:  scrotal wound  POST-OPERATIVE DIAGNOSIS:  scrotal wound  PROCEDURE:  Procedure(s): SCROTUM EXPLORATION (N/A) INCISION AND DRAINAGE ABSCESS (N/A)  SURGEON:  Surgeon(s) and Role:    * Linsey Hirota, Candee Furbish, MD - Primary  PHYSICIAN ASSISTANT:   ASSISTANTS: none   ANESTHESIA:   general  EBL:  10 mL   BLOOD ADMINISTERED:none  DRAINS:  iodiform packing    LOCAL MEDICATIONS USED:  NONE  SPECIMEN:  Source of Specimen:  left spermatic cord  DISPOSITION OF SPECIMEN:  PATHOLOGY  COUNTS:  YES  TOURNIQUET:  * No tourniquets in log *  DICTATION: .Note written in EPIC  PLAN OF CARE: Discharge to home after PACU  PATIENT DISPOSITION:  PACU - hemodynamically stable.   Delay start of Pharmacological VTE agent (>24hrs) due to surgical blood loss or risk of bleeding: not applicable

## 2022-06-17 NOTE — Anesthesia Preprocedure Evaluation (Signed)
Anesthesia Evaluation  Patient identified by MRN, date of birth, ID band Patient awake    Reviewed: Allergy & Precautions, H&P , NPO status , Patient's Chart, lab work & pertinent test results, reviewed documented beta blocker date and time   Airway Mallampati: II  TM Distance: >3 FB Neck ROM: full    Dental no notable dental hx.    Pulmonary neg pulmonary ROS, former smoker,    Pulmonary exam normal breath sounds clear to auscultation       Cardiovascular Exercise Tolerance: Good + dysrhythmias Atrial Fibrillation  Rhythm:regular Rate:Normal     Neuro/Psych negative neurological ROS  negative psych ROS   GI/Hepatic negative GI ROS, Neg liver ROS,   Endo/Other  negative endocrine ROSdiabetes  Renal/GU negative Renal ROS  negative genitourinary   Musculoskeletal   Abdominal   Peds  Hematology negative hematology ROS (+)   Anesthesia Other Findings   Reproductive/Obstetrics negative OB ROS                             Anesthesia Physical Anesthesia Plan  ASA: 2  Anesthesia Plan: General and General LMA   Post-op Pain Management:    Induction:   PONV Risk Score and Plan: Ondansetron  Airway Management Planned:   Additional Equipment:   Intra-op Plan:   Post-operative Plan:   Informed Consent: I have reviewed the patients History and Physical, chart, labs and discussed the procedure including the risks, benefits and alternatives for the proposed anesthesia with the patient or authorized representative who has indicated his/her understanding and acceptance.     Dental Advisory Given  Plan Discussed with: CRNA  Anesthesia Plan Comments:         Anesthesia Quick Evaluation

## 2022-06-17 NOTE — Anesthesia Procedure Notes (Signed)
Procedure Name: LMA Insertion Date/Time: 06/17/2022 7:51 AM  Performed by: Jonna Munro, CRNAPre-anesthesia Checklist: Patient identified, Emergency Drugs available, Suction available, Patient being monitored and Timeout performed Patient Re-evaluated:Patient Re-evaluated prior to induction Oxygen Delivery Method: Circle system utilized Preoxygenation: Pre-oxygenation with 100% oxygen Induction Type: IV induction LMA: LMA inserted LMA Size: 4.0 Number of attempts: 1 Placement Confirmation: positive ETCO2, CO2 detector and breath sounds checked- equal and bilateral Tube secured with: Tape Dental Injury: Teeth and Oropharynx as per pre-operative assessment

## 2022-06-17 NOTE — Anesthesia Postprocedure Evaluation (Signed)
Anesthesia Post Note  Patient: Ryan Lowe  Procedure(s) Performed: SCROTUM EXPLORATION (Scrotum) INCISION AND DRAINAGE ABSCESS (Scrotum)  Patient location during evaluation: Phase II Anesthesia Type: General Level of consciousness: awake Pain management: pain level controlled Vital Signs Assessment: post-procedure vital signs reviewed and stable Respiratory status: spontaneous breathing and respiratory function stable Cardiovascular status: blood pressure returned to baseline and stable Postop Assessment: no headache and no apparent nausea or vomiting Anesthetic complications: no Comments: Late entry   No notable events documented.   Last Vitals:  Vitals:   06/17/22 0901 06/17/22 0920  BP: 128/85 118/78  Pulse: 81 79  Resp: 19 17  Temp:  36.6 C  SpO2: 95% 98%    Last Pain:  Vitals:   06/17/22 0920  TempSrc: Oral  PainSc: [place]

## 2022-06-17 NOTE — Transfer of Care (Signed)
Immediate Anesthesia Transfer of Care Note  Patient: Ryan Lowe  Procedure(s) Performed: SCROTUM EXPLORATION (Scrotum) INCISION AND DRAINAGE ABSCESS (Scrotum)  Patient Location: PACU  Anesthesia Type:General  Level of Consciousness: awake, alert , oriented and patient cooperative  Airway & Oxygen Therapy: Patient Spontanous Breathing  Post-op Assessment: Report given to RN, Post -op Vital signs reviewed and stable and Patient moving all extremities X 4  Post vital signs: Reviewed and stable  Last Vitals:  Vitals Value Taken Time  BP 124/89 06/17/22 0838  Temp    Pulse 91 06/17/22 0841  Resp 11 06/17/22 0841  SpO2 96 % 06/17/22 0841  Vitals shown include unvalidated device data.  Last Pain:  Vitals:   06/17/22 0654  TempSrc: Oral  PainSc: 0-No pain      Patients Stated Pain Goal: 8 (75/10/25 8527)  Complications: No notable events documented.

## 2022-06-17 NOTE — Interval H&P Note (Signed)
History and Physical Interval Note:  06/17/2022 7:36 AM  Ryan Lowe  has presented today for surgery, with the diagnosis of scrotal wound.  The various methods of treatment have been discussed with the patient and family. After consideration of risks, benefits and other options for treatment, the patient has consented to  Procedure(s): SCROTUM EXPLORATION (N/A) INCISION AND DRAINAGE ABSCESS (N/A) as a surgical intervention.  The patient's history has been reviewed, patient examined, no change in status, stable for surgery.  I have reviewed the patient's chart and labs.  Questions were answered to the patient's satisfaction.     Nicolette Bang

## 2022-06-20 ENCOUNTER — Ambulatory Visit: Payer: Medicare Other | Admitting: Urology

## 2022-06-20 VITALS — BP 126/80 | HR 78

## 2022-06-20 DIAGNOSIS — T8141XA Infection following a procedure, superficial incisional surgical site, initial encounter: Secondary | ICD-10-CM

## 2022-06-20 LAB — SURGICAL PATHOLOGY

## 2022-06-20 MED ORDER — CLOTRIMAZOLE-BETAMETHASONE 1-0.05 % EX CREA: 1.0000 | TOPICAL_CREAM | Freq: Two times a day (BID) | CUTANEOUS | 0 refills | Status: DC

## 2022-06-21 ENCOUNTER — Encounter (HOSPITAL_COMMUNITY): Payer: Self-pay | Admitting: Urology

## 2022-06-22 ENCOUNTER — Ambulatory Visit (INDEPENDENT_AMBULATORY_CARE_PROVIDER_SITE_OTHER): Payer: Medicare Other | Admitting: Urology

## 2022-06-22 VITALS — BP 120/78 | HR 86

## 2022-06-22 DIAGNOSIS — T8141XA Infection following a procedure, superficial incisional surgical site, initial encounter: Secondary | ICD-10-CM

## 2022-06-22 NOTE — Progress Notes (Signed)
06/22/2022 2:15 PM   Ryan Lowe [birth year] [id number]  Referring provider: Glenda Chroman, MD [address] [place],  [place] [address]  Followup scrotal abscess   HPI: Ryan Lowe is here for packing change for a scrotal abscess. Mild drainage from incision   PMH: Past Medical History:  Diagnosis Date   Atrial fibrillation ([place])    Diabetes mellitus without complication ([place])    Dysrhythmia    History of sinus surgery     Surgical History: Past Surgical History:  Procedure Laterality Date   COLON RESECTION  12/21/2020   removal of right colon   INCISION AND DRAINAGE ABSCESS N/A 06/17/2022   Procedure: INCISION AND DRAINAGE ABSCESS;  Surgeon: Cleon Gustin, MD;  Location: [facility];  Service: Urology;  Laterality: N/A;   ORCHIECTOMY Left 11/19/2021   Procedure: ORCHIECTOMY;  Surgeon: Cleon Gustin, MD;  Location: [facility];  Service: Urology;  Laterality: Left;   SCROTAL EXPLORATION Left 11/19/2021   Procedure: SCROTUM EXPLORATION- Incision and debridement;  Surgeon: Cleon Gustin, MD;  Location: [facility];  Service: Urology;  Laterality: Left;   SCROTAL EXPLORATION N/A 06/17/2022   Procedure: SCROTUM EXPLORATION;  Surgeon: Cleon Gustin, MD;  Location: [facility];  Service: Urology;  Laterality: N/A;    Home Medications:  Allergies as of 06/22/2022       Reactions   Tamsulosin Other (See Comments)   Abdominal bloating, nausea, fatigue, dysnea        Medication List        Accurate as of June 22, 2022  2:15 PM. If you have any questions, ask your nurse or doctor.          aspirin 325 MG tablet Take 325 mg by mouth daily.   bacitracin 500 UNIT/GM ointment Apply 1 application. topically 2 (two) times daily.   clotrimazole-betamethasone cream Commonly known as: Lotrisone Apply 1 application. topically 2 (two) times daily.   clotrimazole-betamethasone cream Commonly known as: Lotrisone Apply 1 Application topically 2 (two) times daily.    diltiazem 300 MG 24 hr capsule Commonly known as: CARDIZEM CD Take 300 mg by mouth daily.   doxycycline 100 MG tablet Commonly known as: VIBRA-TABS TAKE (1) TABLET TWICE DAILY.   fish oil-omega-3 fatty acids 1000 MG capsule Take 1 capsule by mouth daily.   glipiZIDE 5 MG tablet Commonly known as: GLUCOTROL Take 5 mg by mouth daily.   One-A-Day Weight Smart Advance Tabs Take 1 tablet by mouth daily.   oxyCODONE-acetaminophen 5-325 MG tablet Commonly known as: Percocet Take 1 tablet by mouth every 4 (four) hours as needed for severe pain.   oxyCODONE-acetaminophen 7.5-325 MG tablet Commonly known as: Percocet Take 1 tablet by mouth every 4 (four) hours as needed for severe pain.   sulfamethoxazole-trimethoprim 800-160 MG tablet Commonly known as: BACTRIM DS Take 1 tablet by mouth 2 (two) times daily.        Allergies:  Allergies  Allergen Reactions   Tamsulosin Other (See Comments)    Abdominal bloating, nausea, fatigue, dysnea    Family History: Family History  Problem Relation Age of Onset   Heart disease Other    Heart failure Other     Social History:  reports that he has quit smoking. His smoking use included cigarettes. He quit smokeless tobacco use about 30 years ago.  His smokeless tobacco use included chew. He reports that he does not drink alcohol and does not use drugs.  ROS: All other review of systems  were reviewed and are negative except what is noted above in HPI  Physical Exam: BP 120/78   Pulse 86   Constitutional:  Alert and oriented, No acute distress. HEENT: [place] AT, moist mucus membranes.  Trachea midline, no masses. Cardiovascular: No clubbing, cyanosis, or edema. Respiratory: Normal respiratory effort, no increased work of breathing. GI: Abdomen is soft, nontender, nondistended, no abdominal masses GU: No CVA tenderness.  Lymph: No cervical or inguinal lymphadenopathy. Skin: No rashes, bruises or suspicious lesions. Neurologic:  Grossly intact, no focal deficits, moving all 4 extremities. Psychiatric: Normal mood and affect.  Laboratory Data: Lab Results  Component Value Date   WBC 20.2 (H) 10/20/2021   HGB 13.7 10/20/2021   HCT 42.2 10/20/2021   MCV 92.1 10/20/2021   PLT 195 10/20/2021    Lab Results  Component Value Date   CREATININE 0.95 06/16/2022    No results found for: "PSA"  No results found for: "TESTOSTERONE"  Lab Results  Component Value Date   HGBA1C 6.4 (H) 06/16/2022    Urinalysis    Component Value Date/Time   COLORURINE YELLOW 10/20/2021 1616   APPEARANCEUR Clear 11/15/2021 1117   LABSPEC 1.023 10/20/2021 1616   PHURINE 5.0 10/20/2021 1616   GLUCOSEU Negative 11/15/2021 1117   HGBUR SMALL (A) 10/20/2021 1616   BILIRUBINUR Negative 11/15/2021 1117   KETONESUR NEGATIVE 10/20/2021 1616   PROTEINUR Negative 11/15/2021 1117   PROTEINUR 100 (A) 10/20/2021 1616   NITRITE Negative 11/15/2021 1117   NITRITE NEGATIVE 10/20/2021 1616   LEUKOCYTESUR Negative 11/15/2021 1117   LEUKOCYTESUR LARGE (A) 10/20/2021 1616    Lab Results  Component Value Date   LABMICR Comment 11/15/2021   BACTERIA RARE (A) 10/20/2021    Pertinent Imaging:  No results found for this or any previous visit.  No results found for this or any previous visit.  No results found for this or any previous visit.  No results found for this or any previous visit.  No results found for this or any previous visit.  No valid procedures specified. No results found for this or any previous visit.  No results found for this or any previous visit.   Assessment & Plan:    1. Infection involving stitch with abscess Packing changed today. RTC Friday for packing change   No follow-ups on file.  Nicolette Bang, MD  [facility] [place]

## 2022-06-23 NOTE — Op Note (Signed)
Preoperative diagnosis: left scrotal wound   Postoperative diagnosis: Same   Procedure: 1. Scrotal exploration 2. Incision and drainage of scrotal abscess 3. Debridement of scrotal skin   Attending: Nicolette Bang, MD   Anesthesia: General   History of blood loss: Minimal   Antibiotics: ancef   Drains: none   Specimens: 1. Left spermatic cord silk stitch 2. Scrotal wall     Findings: Purulent drainage from nonhealing superior left scrotal wall wound. Silk stitch removed from base of the wound. Debridement area of scrotal wall 10 square centimeters   Indications: Patient is a 66 year old male with a history of leftt scrotal wound after simple orchiectomy.  We discussed the treatment options including observation versus scrotal exploration after discussing treatment options he proceed with scrotal exploration.    Procedure in detail: Prior to procedure consent was obtained.  Patient was brought to the operating room and a brief timeout was done to ensure correct patient, correct procedure, correct site.  General anesthesia was administered and patient was placed in supine position.  His genitalia was then prepped and draped in usual sterile fashion.  A 7cm elliptical incision was made in the left scrotal wound. At this time we then incised the abscess and drained the abscess cavity. We dissected down the sinus tract and at the base of the wound was a silk stitch. The silk stitch was incised. We then excised  the granulation tissue on the scrotal wall and we excised the sinus tract. We then irrigated the wound with normal saline. We then closed the subcutaneous tissue with 3-0 vicryl in a running fashion. We then used iodiform gauze to pack the abscess cavity. The skin was then loosely closed with 3-0 vicryl in an interrupted fashion. We then returned the testis to the left hemiscrotum and closed the overlying dartos with 3-0 vicryl in a running fashion.  A dressing was then applied to the  incision.  We then placed a scrotal fluff and this then concluded the procedure which was well tolerated by the patient.   Complications: None   Condition: Stable, extubated, transferred to PACU.   Plan: Patient is to be discharged home.  He is to follow up in 2 days for wound check and packing change

## 2022-06-24 ENCOUNTER — Encounter: Payer: Self-pay | Admitting: Physician Assistant

## 2022-06-24 ENCOUNTER — Ambulatory Visit (INDEPENDENT_AMBULATORY_CARE_PROVIDER_SITE_OTHER): Payer: Medicare Other | Admitting: Physician Assistant

## 2022-06-24 VITALS — BP 128/85 | HR 73

## 2022-06-24 DIAGNOSIS — T8141XA Infection following a procedure, superficial incisional surgical site, initial encounter: Secondary | ICD-10-CM

## 2022-06-24 NOTE — Progress Notes (Signed)
Assessment: 1. Infection involving stitch with abscess    Plan: Wound repacked and pt tolerated well. Advised to gently cleanse inguinal spaces and apply clotrimazole twice daily. FU Monday for wound packing change and skin check.  Chief Complaint: No chief complaint on file.   HPI: Ryan Lowe is a 66 y.o. male who presents for post op evaluation of scrotal abscess performed on 06/17/22. Since dressing/packing change 2 days ago, pt reports persistent drainage, but not significant pain. Using Clotrimazole only on glans.  Portions of the above documentation were copied from a prior visit for review purposes only.  Allergies: Allergies  Allergen Reactions   Tamsulosin Other (See Comments)    Abdominal bloating, nausea, fatigue, dysnea    PMH: Past Medical History:  Diagnosis Date   Atrial fibrillation (HCC)    Diabetes mellitus without complication (HCC)    Dysrhythmia    History of sinus surgery     PSH: Past Surgical History:  Procedure Laterality Date   COLON RESECTION  12/21/2020   removal of right colon   INCISION AND DRAINAGE ABSCESS N/A 06/17/2022   Procedure: INCISION AND DRAINAGE ABSCESS;  Surgeon: Malen Gauze, MD;  Location: [facility];  Service: Urology;  Laterality: N/A;   ORCHIECTOMY Left 11/19/2021   Procedure: ORCHIECTOMY;  Surgeon: Malen Gauze, MD;  Location: [facility];  Service: Urology;  Laterality: Left;   SCROTAL EXPLORATION Left 11/19/2021   Procedure: SCROTUM EXPLORATION- Incision and debridement;  Surgeon: Malen Gauze, MD;  Location: [facility];  Service: Urology;  Laterality: Left;   SCROTAL EXPLORATION N/A 06/17/2022   Procedure: SCROTUM EXPLORATION;  Surgeon: Malen Gauze, MD;  Location: [facility];  Service: Urology;  Laterality: N/A;    SH: Social History   Tobacco Use   Smoking status: Former    Types: Cigarettes   Smokeless tobacco: Former    Types: Chew    Quit date: 08/30/1991   Tobacco comments:    Occasional  cig mostly dipped.  Vaping Use   Vaping Use: Never used  Substance Use Topics   Alcohol use: No   Drug use: No    ROS: All other review of systems were reviewed and are negative except what is noted above in HPI  PE: BP 128/85   Pulse 73  GENERAL APPEARANCE:  Well appearing, well developed, NAD HEENT:  Atraumatic, normocephalic NECK:  Supple. Trachea midline ABDOMEN:  Soft, non-tender, no masses GU: Wound on left superior scrotum with blood tinged purulent drainage. No odor. Packing removed and replaced without difficulty. Chaperone present.  Inguinal spaces indicate well demarcated erythematous and sloughing areas consistent with candidal infection.  No induration or warmth. EXTREMITIES:  Moves all extremities well NEUROLOGIC:  Alert and oriented x 3 MENTAL STATUS:  appropriate SKIN:  Warm, dry, and intact   Results: Laboratory Data: Lab Results  Component Value Date   WBC 20.2 (H) 10/20/2021   HGB 13.7 10/20/2021   HCT 42.2 10/20/2021   MCV 92.1 10/20/2021   PLT 195 10/20/2021    Lab Results  Component Value Date   CREATININE 0.95 06/16/2022    Lab Results  Component Value Date   HGBA1C 6.4 (H) 06/16/2022    Urinalysis    Component Value Date/Time   COLORURINE YELLOW 10/20/2021 1616   APPEARANCEUR Clear 11/15/2021 1117   LABSPEC 1.023 10/20/2021 1616   PHURINE 5.0 10/20/2021 1616   GLUCOSEU Negative 11/15/2021 1117   HGBUR SMALL (A) 10/20/2021 1616   BILIRUBINUR Negative 11/15/2021  [place] 10/20/2021 1616   PROTEINUR Negative 11/15/2021 1117   PROTEINUR 100 (A) 10/20/2021 1616   NITRITE Negative 11/15/2021 1117   NITRITE NEGATIVE 10/20/2021 1616   LEUKOCYTESUR Negative 11/15/2021 1117   LEUKOCYTESUR LARGE (A) 10/20/2021 1616    Lab Results  Component Value Date   LABMICR Comment 11/15/2021   BACTERIA RARE (A) 10/20/2021    Pertinent Imaging: No results found for this or any previous visit.  No results found for this or any  previous visit.  No results found for this or any previous visit.  No results found for this or any previous visit.  No results found for this or any previous visit.  No valid procedures specified. No results found for this or any previous visit.  No results found for this or any previous visit.  No results found for this or any previous visit (from the past 24 hour(s)).

## 2022-06-27 ENCOUNTER — Ambulatory Visit (INDEPENDENT_AMBULATORY_CARE_PROVIDER_SITE_OTHER): Payer: Medicare Other | Admitting: Physician Assistant

## 2022-06-27 VITALS — BP 136/85 | HR 90 | Wt 330.0 lb

## 2022-06-27 DIAGNOSIS — T8141XA Infection following a procedure, superficial incisional surgical site, initial encounter: Secondary | ICD-10-CM

## 2022-06-27 DIAGNOSIS — B3789 Other sites of candidiasis: Secondary | ICD-10-CM

## 2022-06-27 NOTE — Progress Notes (Signed)
06/20/2022 9:07 AM   Bethanie Dicker 09-24-1955 [id number]  Referring provider: Glenda Chroman, MD [address] [place],  [place] [address]  Followup scrotal abscess   HPI: Mr Forshee is a [id number] here for followup for a scrotal abscess. Mild drainage from incision. No pain of swelling of the incision.    PMH: Past Medical History:  Diagnosis Date   Atrial fibrillation ([place])    Diabetes mellitus without complication ([place])    Dysrhythmia    History of sinus surgery     Surgical History: Past Surgical History:  Procedure Laterality Date   COLON RESECTION  12/21/2020   removal of right colon   INCISION AND DRAINAGE ABSCESS N/A 06/17/2022   Procedure: INCISION AND DRAINAGE ABSCESS;  Surgeon: Cleon Gustin, MD;  Location: [facility];  Service: Urology;  Laterality: N/A;   ORCHIECTOMY Left 11/19/2021   Procedure: ORCHIECTOMY;  Surgeon: Cleon Gustin, MD;  Location: [facility];  Service: Urology;  Laterality: Left;   SCROTAL EXPLORATION Left 11/19/2021   Procedure: SCROTUM EXPLORATION- Incision and debridement;  Surgeon: Cleon Gustin, MD;  Location: [facility];  Service: Urology;  Laterality: Left;   SCROTAL EXPLORATION N/A 06/17/2022   Procedure: SCROTUM EXPLORATION;  Surgeon: Cleon Gustin, MD;  Location: [facility];  Service: Urology;  Laterality: N/A;    Home Medications:  Allergies as of 06/20/2022       Reactions   Tamsulosin Other (See Comments)   Abdominal bloating, nausea, fatigue, dysnea        Medication List        Accurate as of June 20, 2022 11:59 PM. If you have any questions, ask your nurse or doctor.          aspirin 325 MG tablet Take 325 mg by mouth daily.   bacitracin 500 UNIT/GM ointment Apply 1 application. topically 2 (two) times daily.   clotrimazole-betamethasone cream Commonly known as: Lotrisone Apply 1 application. topically 2 (two) times daily. What changed: Another medication with the same name was added. Make sure you  understand how and when to take each.   clotrimazole-betamethasone cream Commonly known as: Lotrisone Apply 1 Application topically 2 (two) times daily. What changed: You were already taking a medication with the same name, and this prescription was added. Make sure you understand how and when to take each.   diltiazem 300 MG 24 hr capsule Commonly known as: CARDIZEM CD Take 300 mg by mouth daily.   doxycycline 100 MG tablet Commonly known as: VIBRA-TABS TAKE (1) TABLET TWICE DAILY.   fish oil-omega-3 fatty acids 1000 MG capsule Take 1 capsule by mouth daily.   glipiZIDE 5 MG tablet Commonly known as: GLUCOTROL Take 5 mg by mouth daily.   One-A-Day Weight Smart Advance Tabs Take 1 tablet by mouth daily.   oxyCODONE-acetaminophen 5-325 MG tablet Commonly known as: Percocet Take 1 tablet by mouth every 4 (four) hours as needed for severe pain.   oxyCODONE-acetaminophen 7.5-325 MG tablet Commonly known as: Percocet Take 1 tablet by mouth every 4 (four) hours as needed for severe pain.   sulfamethoxazole-trimethoprim 800-160 MG tablet Commonly known as: BACTRIM DS Take 1 tablet by mouth 2 (two) times daily.        Allergies:  Allergies  Allergen Reactions   Tamsulosin Other (See Comments)    Abdominal bloating, nausea, fatigue, dysnea    Family History: Family History  Problem Relation Age of Onset   Heart disease Other    Heart failure  Other     Social History:  reports that he has quit smoking. His smoking use included cigarettes. He quit smokeless tobacco use about 30 years ago.  His smokeless tobacco use included chew. He reports that he does not drink alcohol and does not use drugs.  ROS: All other review of systems were reviewed and are negative except what is noted above in HPI  Physical Exam: BP 126/80   Pulse 78   Constitutional:  Alert and oriented, No acute distress. HEENT: [place] AT, moist mucus membranes.  Trachea midline, no  masses. Cardiovascular: No clubbing, cyanosis, or edema. Respiratory: Normal respiratory effort, no increased work of breathing. GI: Abdomen is soft, nontender, nondistended, no abdominal masses GU: No CVA tenderness.  Lymph: No cervical or inguinal lymphadenopathy. Skin: No rashes, bruises or suspicious lesions. Neurologic: Grossly intact, no focal deficits, moving all 4 extremities. Psychiatric: Normal mood and affect.  Laboratory Data: Lab Results  Component Value Date   WBC 20.2 (H) 10/20/2021   HGB 13.7 10/20/2021   HCT 42.2 10/20/2021   MCV 92.1 10/20/2021   PLT 195 10/20/2021    Lab Results  Component Value Date   CREATININE 0.95 06/16/2022    No results found for: "PSA"  No results found for: "TESTOSTERONE"  Lab Results  Component Value Date   HGBA1C 6.4 (H) 06/16/2022    Urinalysis    Component Value Date/Time   COLORURINE YELLOW 10/20/2021 1616   APPEARANCEUR Clear 11/15/2021 1117   LABSPEC 1.023 10/20/2021 1616   PHURINE 5.0 10/20/2021 1616   GLUCOSEU Negative 11/15/2021 1117   HGBUR SMALL (A) 10/20/2021 1616   BILIRUBINUR Negative 11/15/2021 1117   KETONESUR NEGATIVE 10/20/2021 1616   PROTEINUR Negative 11/15/2021 1117   PROTEINUR 100 (A) 10/20/2021 1616   NITRITE Negative 11/15/2021 1117   NITRITE NEGATIVE 10/20/2021 1616   LEUKOCYTESUR Negative 11/15/2021 1117   LEUKOCYTESUR LARGE (A) 10/20/2021 1616    Lab Results  Component Value Date   LABMICR Comment 11/15/2021   BACTERIA RARE (A) 10/20/2021    Pertinent Imaging:  No results found for this or any previous visit.  No results found for this or any previous visit.  No results found for this or any previous visit.  No results found for this or any previous visit.  No results found for this or any previous visit.  No valid procedures specified. No results found for this or any previous visit.  No results found for this or any previous visit.   Assessment & Plan:    1.  Infection involving stitch with abscess Packing changed today. Followup Wednesday for packing change   No follow-ups on file.  Nicolette Bang, MD  [facility] [place]

## 2022-06-27 NOTE — Progress Notes (Signed)
Assessment: 1. Infection involving stitch with abscess  2. Candida rash of groin    Plan: Continue skin cleansing and clotrimazole as previously instructed.  We will follow-up in 2 days for next packing exchange.  Chief Complaint: No chief complaint on file.   HPI: Ryan Lowe is a 66 y.o. male who presents for post op evaluation of I&D of suture abscess. PO day # 10.  Patient states drainage has slowed down significantly.  Burning and exudate in the inguinal spaces has improved after he started cleansing the area gently twice daily and applying clotrimazole.    Portions of the above documentation were copied from a prior visit for review purposes only.  Allergies: Allergies  Allergen Reactions   Tamsulosin Other (See Comments)    Abdominal bloating, nausea, fatigue, dysnea    PMH: Past Medical History:  Diagnosis Date   Atrial fibrillation (HCC)    Diabetes mellitus without complication (HCC)    Dysrhythmia    History of sinus surgery     PSH: Past Surgical History:  Procedure Laterality Date   COLON RESECTION  12/21/2020   removal of right colon   INCISION AND DRAINAGE ABSCESS N/A 06/17/2022   Procedure: INCISION AND DRAINAGE ABSCESS;  Surgeon: Malen Gauze, MD;  Location: [facility];  Service: Urology;  Laterality: N/A;   ORCHIECTOMY Left 11/19/2021   Procedure: ORCHIECTOMY;  Surgeon: Malen Gauze, MD;  Location: [facility];  Service: Urology;  Laterality: Left;   SCROTAL EXPLORATION Left 11/19/2021   Procedure: SCROTUM EXPLORATION- Incision and debridement;  Surgeon: Malen Gauze, MD;  Location: [facility];  Service: Urology;  Laterality: Left;   SCROTAL EXPLORATION N/A 06/17/2022   Procedure: SCROTUM EXPLORATION;  Surgeon: Malen Gauze, MD;  Location: [facility];  Service: Urology;  Laterality: N/A;    SH: Social History   Tobacco Use   Smoking status: Former    Types: Cigarettes   Smokeless tobacco: Former    Types: Chew    Quit date:  08/30/1991   Tobacco comments:    Occasional cig mostly dipped.  Vaping Use   Vaping Use: Never used  Substance Use Topics   Alcohol use: No   Drug use: No    ROS: All other review of systems were reviewed and are negative except what is noted above in HPI  PE: BP 136/85   Pulse 90   Wt (!) 330 lb (149.7 kg)   BMI 38.14 kg/m  GENERAL APPEARANCE:  Well appearing, well developed, NAD HEENT:  Atraumatic, normocephalic NECK:  Supple. Trachea midline ABDOMEN:  Soft, non-tender, no masses, no CVAT GU: packing exchanged without difficulty. Minimal blood tinged drainage. No swelling/erythema. Remnant of lateral vicryl suture removed from incision. No dehiscence noted. Inguinal tissue less red and without exudate.  EXTREMITIES:  Moves all extremities well NEUROLOGIC:  Alert and oriented x 3 MENTAL STATUS:  appropriate SKIN:  Warm, dry, and intact   Results: Laboratory Data: Lab Results  Component Value Date   WBC 20.2 (H) 10/20/2021   HGB 13.7 10/20/2021   HCT 42.2 10/20/2021   MCV 92.1 10/20/2021   PLT 195 10/20/2021    Lab Results  Component Value Date   CREATININE 0.95 06/16/2022    No results found for: "PSA"  No results found for: "TESTOSTERONE"  Lab Results  Component Value Date   HGBA1C 6.4 (H) 06/16/2022    Urinalysis    Component Value Date/Time   COLORURINE YELLOW 10/20/2021 1616   APPEARANCEUR Clear  11/15/2021 1117   LABSPEC 1.023 10/20/2021 1616   PHURINE 5.0 10/20/2021 1616   GLUCOSEU Negative 11/15/2021 1117   HGBUR SMALL (A) 10/20/2021 1616   BILIRUBINUR Negative 11/15/2021 1117   KETONESUR NEGATIVE 10/20/2021 1616   PROTEINUR Negative 11/15/2021 1117   PROTEINUR 100 (A) 10/20/2021 1616   NITRITE Negative 11/15/2021 1117   NITRITE NEGATIVE 10/20/2021 1616   LEUKOCYTESUR Negative 11/15/2021 1117   LEUKOCYTESUR LARGE (A) 10/20/2021 1616    Lab Results  Component Value Date   LABMICR Comment 11/15/2021   BACTERIA RARE (A) 10/20/2021     Pertinent Imaging: No results found for this or any previous visit.  No results found for this or any previous visit.  No results found for this or any previous visit.  No results found for this or any previous visit.  No results found for this or any previous visit.  No valid procedures specified. No results found for this or any previous visit.  No results found for this or any previous visit.  No results found for this or any previous visit (from the past 24 hour(s)).

## 2022-06-29 ENCOUNTER — Ambulatory Visit (INDEPENDENT_AMBULATORY_CARE_PROVIDER_SITE_OTHER): Payer: Medicare Other | Admitting: Physician Assistant

## 2022-06-29 ENCOUNTER — Encounter: Payer: Self-pay | Admitting: Physician Assistant

## 2022-06-29 VITALS — BP 131/85 | HR 92 | Wt 330.0 lb

## 2022-06-29 DIAGNOSIS — N454 Abscess of epididymis or testis: Secondary | ICD-10-CM

## 2022-06-29 DIAGNOSIS — B3789 Other sites of candidiasis: Secondary | ICD-10-CM

## 2022-06-29 DIAGNOSIS — T8141XA Infection following a procedure, superficial incisional surgical site, initial encounter: Secondary | ICD-10-CM | POA: Diagnosis not present

## 2022-06-29 MED ORDER — AMOXICILLIN-POT CLAVULANATE 875-125 MG PO TABS: 1.0000 | ORAL_TABLET | Freq: Two times a day (BID) | ORAL | 0 refills | Status: DC

## 2022-06-29 NOTE — Progress Notes (Signed)
Assessment: 1. Infection involving stitch with abscess  2. Candida rash of groin  3. Epididymo-orchitis with abscess    Plan: Wound repacked and pt will start Augmentin BID per Dr. Alyson Ingles. FU in 2 days for wound check and packing change as scheduled. Continue to keep skin clean and dry in the inguinal spaces.  Chief Complaint: No chief complaint on file.   HPI: Ryan Lowe is a 66 y.o. male who presents for post op evaluation of I &D for scrotal/suture abscess.  Patient reports persistent drainage since last packing change.  It remains blood-tinged.  Inguinal rash is no longer uncomfortable and he continues to gently cleanse the area with soap and water.  He is no longer using clotrimazole.  No fever, chills, abdominal pain, nausea vomiting.  No new skin changes reported.    Portions of the above documentation were copied from a prior visit for review purposes only.  Allergies: Allergies  Allergen Reactions   Tamsulosin Other (See Comments)    Abdominal bloating, nausea, fatigue, dysnea    PMH: Past Medical History:  Diagnosis Date   Atrial fibrillation ([place])    Diabetes mellitus without complication ([place])    Dysrhythmia    History of sinus surgery     PSH: Past Surgical History:  Procedure Laterality Date   COLON RESECTION  12/21/2020   removal of right colon   INCISION AND DRAINAGE ABSCESS N/A 06/17/2022   Procedure: INCISION AND DRAINAGE ABSCESS;  Surgeon: Cleon Gustin, MD;  Location: [facility];  Service: Urology;  Laterality: N/A;   ORCHIECTOMY Left 11/19/2021   Procedure: ORCHIECTOMY;  Surgeon: Cleon Gustin, MD;  Location: [facility];  Service: Urology;  Laterality: Left;   SCROTAL EXPLORATION Left 11/19/2021   Procedure: SCROTUM EXPLORATION- Incision and debridement;  Surgeon: Cleon Gustin, MD;  Location: [facility];  Service: Urology;  Laterality: Left;   SCROTAL EXPLORATION N/A 06/17/2022   Procedure: SCROTUM EXPLORATION;  Surgeon: Cleon Gustin, MD;  Location: [facility];  Service: Urology;  Laterality: N/A;    SH: Social History   Tobacco Use   Smoking status: Former    Types: Cigarettes   Smokeless tobacco: Former    Types: Chew    Quit date: 08/30/1991   Tobacco comments:    Occasional cig mostly dipped.  Vaping Use   Vaping Use: Never used  Substance Use Topics   Alcohol use: No   Drug use: No    ROS: All other review of systems were reviewed and are negative except what is noted above in HPI  PE: BP 131/85   Pulse 92   Wt (!) 330 lb (149.7 kg)   BMI 38.14 kg/m  GENERAL APPEARANCE:  Well appearing, well developed, NAD HEENT:  Atraumatic, normocephalic NECK:  Supple. Trachea midline ABDOMEN:  Soft, non-tender, no masses, no CVAT Iodoform packing changed in depth of the incisional wound has improved approximately 2 cm at this time.  He continues with significant blood-tinged purulence.  No surrounding induration, erythema, fluctuance.  Remainder of Vicryl sutures remain intact. EXTREMITIES:  Moves all extremities well NEUROLOGIC:  Alert and oriented x 3 MENTAL STATUS:  appropriate SKIN:  Warm, dry, and intact   Results: Laboratory Data: Lab Results  Component Value Date   WBC 20.2 (H) 10/20/2021   HGB 13.7 10/20/2021   HCT 42.2 10/20/2021   MCV 92.1 10/20/2021   PLT 195 10/20/2021    Lab Results  Component Value Date   CREATININE 0.95 06/16/2022  Lab Results  Component Value Date   HGBA1C 6.4 (H) 06/16/2022    Urinalysis    Component Value Date/Time   COLORURINE YELLOW 10/20/2021 1616   APPEARANCEUR Clear 11/15/2021 1117   LABSPEC 1.023 10/20/2021 1616   PHURINE 5.0 10/20/2021 1616   GLUCOSEU Negative 11/15/2021 1117   HGBUR SMALL (A) 10/20/2021 1616   BILIRUBINUR Negative 11/15/2021 1117   KETONESUR NEGATIVE 10/20/2021 1616   PROTEINUR Negative 11/15/2021 1117   PROTEINUR 100 (A) 10/20/2021 1616   NITRITE Negative 11/15/2021 1117   NITRITE NEGATIVE 10/20/2021 1616    LEUKOCYTESUR Negative 11/15/2021 1117   LEUKOCYTESUR LARGE (A) 10/20/2021 1616    Lab Results  Component Value Date   LABMICR Comment 11/15/2021   BACTERIA RARE (A) 10/20/2021    Pertinent Imaging: No results found for this or any previous visit.  No results found for this or any previous visit.  No results found for this or any previous visit.  No results found for this or any previous visit.  No results found for this or any previous visit.  No valid procedures specified. No results found for this or any previous visit.  No results found for this or any previous visit.  No results found for this or any previous visit (from the past 24 hour(s)).

## 2022-07-01 ENCOUNTER — Ambulatory Visit (INDEPENDENT_AMBULATORY_CARE_PROVIDER_SITE_OTHER): Payer: Medicare Other | Admitting: Urology

## 2022-07-01 VITALS — BP 130/84 | HR 94

## 2022-07-01 DIAGNOSIS — T8141XD Infection following a procedure, superficial incisional surgical site, subsequent encounter: Secondary | ICD-10-CM | POA: Diagnosis not present

## 2022-07-01 DIAGNOSIS — T8141XA Infection following a procedure, superficial incisional surgical site, initial encounter: Secondary | ICD-10-CM

## 2022-07-01 NOTE — Progress Notes (Signed)
07/01/2022 12:18 PM   Ryan Lowe 04-24-1956 [id number]  Referring provider: Ignatius Specking, MD [address] [place],  [place] [address]  Followup scrotal wound   HPI: Ryan Lowe is a 76AU here for scrotal wound evaluation. No drainage from incision. No worsening edema or erythema. No other complaints otday   PMH: Past Medical History:  Diagnosis Date   Atrial fibrillation (HCC)    Diabetes mellitus without complication (HCC)    Dysrhythmia    History of sinus surgery     Surgical History: Past Surgical History:  Procedure Laterality Date   COLON RESECTION  12/21/2020   removal of right colon   INCISION AND DRAINAGE ABSCESS N/A 06/17/2022   Procedure: INCISION AND DRAINAGE ABSCESS;  Surgeon: Ryan Gauze, MD;  Location: [facility];  Service: Urology;  Laterality: N/A;   ORCHIECTOMY Left 11/19/2021   Procedure: ORCHIECTOMY;  Surgeon: Ryan Gauze, MD;  Location: [facility];  Service: Urology;  Laterality: Left;   SCROTAL EXPLORATION Left 11/19/2021   Procedure: SCROTUM EXPLORATION- Incision and debridement;  Surgeon: Ryan Gauze, MD;  Location: [facility];  Service: Urology;  Laterality: Left;   SCROTAL EXPLORATION N/A 06/17/2022   Procedure: SCROTUM EXPLORATION;  Surgeon: Ryan Gauze, MD;  Location: [facility];  Service: Urology;  Laterality: N/A;    Home Medications:  Allergies as of 07/01/2022       Reactions   Tamsulosin Other (See Comments)   Abdominal bloating, nausea, fatigue, dysnea        Medication List        Accurate as of July 01, 2022 12:18 PM. If you have any questions, ask your nurse or doctor.          amoxicillin-clavulanate 875-125 MG tablet Commonly known as: AUGMENTIN Take 1 tablet by mouth every 12 (twelve) hours.   aspirin 325 MG tablet Take 325 mg by mouth daily.   bacitracin 500 UNIT/GM ointment Apply 1 application. topically 2 (two) times daily.   clotrimazole-betamethasone cream Commonly known as:  Lotrisone Apply 1 application. topically 2 (two) times daily.   clotrimazole-betamethasone cream Commonly known as: Lotrisone Apply 1 Application topically 2 (two) times daily.   diltiazem 300 MG 24 hr capsule Commonly known as: CARDIZEM CD Take 300 mg by mouth daily.   fish oil-omega-3 fatty acids 1000 MG capsule Take 1 capsule by mouth daily.   glipiZIDE 5 MG tablet Commonly known as: GLUCOTROL Take 5 mg by mouth daily.   One-A-Day Weight Smart Advance Tabs Take 1 tablet by mouth daily.   oxyCODONE-acetaminophen 5-325 MG tablet Commonly known as: Percocet Take 1 tablet by mouth every 4 (four) hours as needed for severe pain.   oxyCODONE-acetaminophen 7.5-325 MG tablet Commonly known as: Percocet Take 1 tablet by mouth every 4 (four) hours as needed for severe pain.        Allergies:  Allergies  Allergen Reactions   Tamsulosin Other (See Comments)    Abdominal bloating, nausea, fatigue, dysnea    Family History: Family History  Problem Relation Age of Onset   Heart disease Other    Heart failure Other     Social History:  reports that he has quit smoking. His smoking use included cigarettes. He quit smokeless tobacco use about 30 years ago.  His smokeless tobacco use included chew. He reports that he does not drink alcohol and does not use drugs.  ROS: All other review of systems were reviewed and are negative except what is noted  above in HPI  Physical Exam: There were no vitals taken for this visit.  Constitutional:  Alert and oriented, No acute distress. HEENT: [place] AT, moist mucus membranes.  Trachea midline, no masses. Cardiovascular: No clubbing, cyanosis, or edema. Respiratory: Normal respiratory effort, no increased work of breathing. GI: Abdomen is soft, nontender, nondistended, no abdominal masses GU: No CVA tenderness.  Lymph: No cervical or inguinal lymphadenopathy. Skin: No rashes, bruises or suspicious lesions. Neurologic: Grossly intact, no  focal deficits, moving all 4 extremities. Psychiatric: Normal mood and affect.  Laboratory Data: Lab Results  Component Value Date   WBC 20.2 (H) 10/20/2021   HGB 13.7 10/20/2021   HCT 42.2 10/20/2021   MCV 92.1 10/20/2021   PLT 195 10/20/2021    Lab Results  Component Value Date   CREATININE 0.95 06/16/2022    No results found for: "PSA"  No results found for: "TESTOSTERONE"  Lab Results  Component Value Date   HGBA1C 6.4 (H) 06/16/2022    Urinalysis    Component Value Date/Time   COLORURINE YELLOW 10/20/2021 1616   APPEARANCEUR Clear 11/15/2021 1117   LABSPEC 1.023 10/20/2021 1616   PHURINE 5.0 10/20/2021 1616   GLUCOSEU Negative 11/15/2021 1117   HGBUR SMALL (A) 10/20/2021 1616   BILIRUBINUR Negative 11/15/2021 1117   KETONESUR NEGATIVE 10/20/2021 1616   PROTEINUR Negative 11/15/2021 1117   PROTEINUR 100 (A) 10/20/2021 1616   NITRITE Negative 11/15/2021 1117   NITRITE NEGATIVE 10/20/2021 1616   LEUKOCYTESUR Negative 11/15/2021 1117   LEUKOCYTESUR LARGE (A) 10/20/2021 1616    Lab Results  Component Value Date   LABMICR Comment 11/15/2021   BACTERIA RARE (A) 10/20/2021    Pertinent Imaging:  No results found for this or any previous visit.  No results found for this or any previous visit.  No results found for this or any previous visit.  No results found for this or any previous visit.  No results found for this or any previous visit.  No valid procedures specified. No results found for this or any previous visit.  No results found for this or any previous visit.   Assessment & Plan:    Scrotal wound -packing removed today. Followup in 1 week for a wound check  No follow-ups on file.  Ryan Bang, MD  [facility] [place]

## 2022-07-08 ENCOUNTER — Ambulatory Visit (INDEPENDENT_AMBULATORY_CARE_PROVIDER_SITE_OTHER): Payer: Medicare Other | Admitting: Urology

## 2022-07-08 VITALS — BP 147/84 | HR 106 | Ht 78.0 in | Wt 330.0 lb

## 2022-07-08 DIAGNOSIS — T8141XA Infection following a procedure, superficial incisional surgical site, initial encounter: Secondary | ICD-10-CM

## 2022-07-08 DIAGNOSIS — T8141XD Infection following a procedure, superficial incisional surgical site, subsequent encounter: Secondary | ICD-10-CM

## 2022-07-08 MED ORDER — CLOTRIMAZOLE-BETAMETHASONE 1-0.05 % EX CREA: 1.0000 | TOPICAL_CREAM | Freq: Two times a day (BID) | CUTANEOUS | 0 refills | Status: DC

## 2022-07-08 NOTE — Progress Notes (Unsigned)
07/08/2022 11:00 AM   Ryan Lowe 08-06-1956 [id number]  Referring provider: Ignatius Specking, MD [address] [place],  [place] [address]  No chief complaint on file.   HPI:    PMH: Past Medical History:  Diagnosis Date   Atrial fibrillation (HCC)    Diabetes mellitus without complication (HCC)    Dysrhythmia    History of sinus surgery     Surgical History: Past Surgical History:  Procedure Laterality Date   COLON RESECTION  12/21/2020   removal of right colon   INCISION AND DRAINAGE ABSCESS N/A 06/17/2022   Procedure: INCISION AND DRAINAGE ABSCESS;  Surgeon: Malen Gauze, MD;  Location: [facility];  Service: Urology;  Laterality: N/A;   ORCHIECTOMY Left 11/19/2021   Procedure: ORCHIECTOMY;  Surgeon: Malen Gauze, MD;  Location: [facility];  Service: Urology;  Laterality: Left;   SCROTAL EXPLORATION Left 11/19/2021   Procedure: SCROTUM EXPLORATION- Incision and debridement;  Surgeon: Malen Gauze, MD;  Location: [facility];  Service: Urology;  Laterality: Left;   SCROTAL EXPLORATION N/A 06/17/2022   Procedure: SCROTUM EXPLORATION;  Surgeon: Malen Gauze, MD;  Location: [facility];  Service: Urology;  Laterality: N/A;    Home Medications:  Allergies as of 07/08/2022       Reactions   Tamsulosin Other (See Comments)   Abdominal bloating, nausea, fatigue, dysnea        Medication List        Accurate as of July 08, 2022 11:00 AM. If you have any questions, ask your nurse or doctor.          amoxicillin-clavulanate 875-125 MG tablet Commonly known as: AUGMENTIN Take 1 tablet by mouth every 12 (twelve) hours.   aspirin 325 MG tablet Take 325 mg by mouth daily.   bacitracin 500 UNIT/GM ointment Apply 1 application. topically 2 (two) times daily.   clotrimazole-betamethasone cream Commonly known as: Lotrisone Apply 1 application. topically 2 (two) times daily.   clotrimazole-betamethasone cream Commonly known as: Lotrisone Apply 1  Application topically 2 (two) times daily.   diltiazem 300 MG 24 hr capsule Commonly known as: CARDIZEM CD Take 300 mg by mouth daily.   fish oil-omega-3 fatty acids 1000 MG capsule Take 1 capsule by mouth daily.   glipiZIDE 5 MG tablet Commonly known as: GLUCOTROL Take 5 mg by mouth daily.   One-A-Day Weight Smart Advance Tabs Take 1 tablet by mouth daily.   oxyCODONE-acetaminophen 5-325 MG tablet Commonly known as: Percocet Take 1 tablet by mouth every 4 (four) hours as needed for severe pain.   oxyCODONE-acetaminophen 7.5-325 MG tablet Commonly known as: Percocet Take 1 tablet by mouth every 4 (four) hours as needed for severe pain.        Allergies:  Allergies  Allergen Reactions   Tamsulosin Other (See Comments)    Abdominal bloating, nausea, fatigue, dysnea    Family History: Family History  Problem Relation Age of Onset   Heart disease Other    Heart failure Other     Social History:  reports that he has quit smoking. His smoking use included cigarettes. He quit smokeless tobacco use about 30 years ago.  His smokeless tobacco use included chew. He reports that he does not drink alcohol and does not use drugs.  ROS: All other review of systems were reviewed and are negative except what is noted above in HPI  Physical Exam: BP (!) 147/84   Pulse (!) 106   Ht 6\' 6"  (1.981  m)   Wt (!) 330 lb (149.7 kg)   BMI 38.14 kg/m   Constitutional:  Alert and oriented, No acute distress. HEENT: [place] AT, moist mucus membranes.  Trachea midline, no masses. Cardiovascular: No clubbing, cyanosis, or edema. Respiratory: Normal respiratory effort, no increased work of breathing. GI: Abdomen is soft, nontender, nondistended, no abdominal masses GU: No CVA tenderness.  Lymph: No cervical or inguinal lymphadenopathy. Skin: No rashes, bruises or suspicious lesions. Neurologic: Grossly intact, no focal deficits, moving all 4 extremities. Psychiatric: Normal mood and  affect.  Laboratory Data: Lab Results  Component Value Date   WBC 20.2 (H) 10/20/2021   HGB 13.7 10/20/2021   HCT 42.2 10/20/2021   MCV 92.1 10/20/2021   PLT 195 10/20/2021    Lab Results  Component Value Date   CREATININE 0.95 06/16/2022    No results found for: "PSA"  No results found for: "TESTOSTERONE"  Lab Results  Component Value Date   HGBA1C 6.4 (H) 06/16/2022    Urinalysis    Component Value Date/Time   COLORURINE YELLOW 10/20/2021 1616   APPEARANCEUR Clear 11/15/2021 1117   LABSPEC 1.023 10/20/2021 1616   PHURINE 5.0 10/20/2021 1616   GLUCOSEU Negative 11/15/2021 1117   HGBUR SMALL (A) 10/20/2021 1616   BILIRUBINUR Negative 11/15/2021 1117   KETONESUR NEGATIVE 10/20/2021 1616   PROTEINUR Negative 11/15/2021 1117   PROTEINUR 100 (A) 10/20/2021 1616   NITRITE Negative 11/15/2021 1117   NITRITE NEGATIVE 10/20/2021 1616   LEUKOCYTESUR Negative 11/15/2021 1117   LEUKOCYTESUR LARGE (A) 10/20/2021 1616    Lab Results  Component Value Date   LABMICR Comment 11/15/2021   BACTERIA RARE (A) 10/20/2021    Pertinent Imaging: *** No results found for this or any previous visit.  No results found for this or any previous visit.  No results found for this or any previous visit.  No results found for this or any previous visit.  No results found for this or any previous visit.  No valid procedures specified. No results found for this or any previous visit.  No results found for this or any previous visit.   Assessment & Plan:      No follow-ups on file.  Wilkie Aye, MD  [facility] [place]

## 2022-07-10 ENCOUNTER — Encounter: Payer: Self-pay | Admitting: Urology

## 2022-07-10 NOTE — Patient Instructions (Signed)
Skin Abscess  A skin abscess is an infected area on or under your skin that contains a collection of pus and other material. An abscess may also be called a furuncle, carbuncle, or boil. An abscess can occur in or on almost any part of your body. Some abscesses break open (rupture) on their own. Most continue to get worse unless they are treated. The infection can spread deeper into the body and eventually into your blood, which can make you feel ill. Treatment usually involves draining the abscess. What are the causes? An abscess occurs when germs, like bacteria, pass through your skin and cause an infection. This may be caused by: A scrape or cut on your skin. A puncture wound through your skin, including a needle injection or insect bite. Blocked oil or sweat glands. Blocked and infected hair follicles. A cyst that forms beneath your skin (sebaceous cyst) and becomes infected. What increases the risk? This condition is more likely to develop in people who: Have a weak body defense system (immune system). Have diabetes. Have dry and irritated skin. Get frequent injections or use illegal IV drugs. Have a foreign body in a wound, such as a splinter. Have problems with their lymph system or veins. What are the signs or symptoms? Symptoms of this condition include: A painful, firm bump under the skin. A bump with pus at the top. This may break through the skin and drain. Other symptoms include: Redness surrounding the abscess site. Warmth. Swelling of the lymph nodes (glands) near the abscess. Tenderness. A sore on the skin. How is this diagnosed? This condition may be diagnosed based on: A physical exam. Your medical history. A sample of pus. This may be used to find out what is causing the infection. Blood tests. Imaging tests, such as an ultrasound, CT scan, or MRI. How is this treated? A small abscess that drains on its own may not need treatment. Treatment for larger abscesses  may include: Moist heat or heat pack applied to the area several times a day. A procedure to drain the abscess (incision and drainage). Antibiotic medicines. For a severe abscess, you may first get antibiotics through an IV and then change to antibiotics by mouth. Follow these instructions at home: Medicines  Take over-the-counter and prescription medicines only as told by your health care provider. If you were prescribed an antibiotic medicine, take it as told by your health care provider. Do not stop taking the antibiotic even if you start to feel better. Abscess care  If you have an abscess that has not drained, apply heat to the affected area. Use the heat source that your health care provider recommends, such as a moist heat pack or a heating pad. Place a towel between your skin and the heat source. Leave the heat on for 20-30 minutes. Remove the heat if your skin turns bright red. This is especially important if you are unable to feel pain, heat, or cold. You may have a greater risk of getting burned. Follow instructions from your health care provider about how to take care of your abscess. Make sure you: Cover the abscess with a bandage (dressing). Change your dressing or gauze as told by your health care provider. Wash your hands with soap and water before you change the dressing or gauze. If soap and water are not available, use hand sanitizer. Check your abscess every day for signs of a worsening infection. Check for: More redness, swelling, or pain. More fluid or blood. Warmth.   More pus or a bad smell. General instructions To avoid spreading the infection: Do not share personal care items, towels, or hot tubs with others. Avoid making skin contact with other people. Keep all follow-up visits as told by your health care provider. This is important. Contact a health care provider if you have: More redness, swelling, or pain around your abscess. More fluid or blood coming from  your abscess. Warm skin around your abscess. More pus or a bad smell coming from your abscess. Muscle aches. Chills or a general ill feeling. Get help right away if you: Have severe pain. See red streaks on your skin spreading away from the abscess. See redness that spreads quickly. Have a fever or chills. Summary A skin abscess is an infected area on or under your skin that contains a collection of pus and other material. A small abscess that drains on its own may not need treatment. Treatment for larger abscesses may include having a procedure to drain the abscess and taking an antibiotic. This information is not intended to replace advice given to you by your health care provider. Make sure you discuss any questions you have with your health care provider. Document Revised: 11/18/2021 Document Reviewed: 05/24/2021 Elsevier Patient Education  2023 [employer] Inc[employer]  

## 2022-07-14 ENCOUNTER — Encounter: Payer: Self-pay | Admitting: Urology

## 2022-07-14 NOTE — Patient Instructions (Signed)
Skin Abscess  A skin abscess is an infected area on or under your skin that contains a collection of pus and other material. An abscess may also be called a furuncle, carbuncle, or boil. An abscess can occur in or on almost any part of your body. Some abscesses break open (rupture) on their own. Most continue to get worse unless they are treated. The infection can spread deeper into the body and eventually into your blood, which can make you feel ill. Treatment usually involves draining the abscess. What are the causes? An abscess occurs when germs, like bacteria, pass through your skin and cause an infection. This may be caused by: A scrape or cut on your skin. A puncture wound through your skin, including a needle injection or insect bite. Blocked oil or sweat glands. Blocked and infected hair follicles. A cyst that forms beneath your skin (sebaceous cyst) and becomes infected. What increases the risk? This condition is more likely to develop in people who: Have a weak body defense system (immune system). Have diabetes. Have dry and irritated skin. Get frequent injections or use illegal IV drugs. Have a foreign body in a wound, such as a splinter. Have problems with their lymph system or veins. What are the signs or symptoms? Symptoms of this condition include: A painful, firm bump under the skin. A bump with pus at the top. This may break through the skin and drain. Other symptoms include: Redness surrounding the abscess site. Warmth. Swelling of the lymph nodes (glands) near the abscess. Tenderness. A sore on the skin. How is this diagnosed? This condition may be diagnosed based on: A physical exam. Your medical history. A sample of pus. This may be used to find out what is causing the infection. Blood tests. Imaging tests, such as an ultrasound, CT scan, or MRI. How is this treated? A small abscess that drains on its own may not need treatment. Treatment for larger abscesses  may include: Moist heat or heat pack applied to the area several times a day. A procedure to drain the abscess (incision and drainage). Antibiotic medicines. For a severe abscess, you may first get antibiotics through an IV and then change to antibiotics by mouth. Follow these instructions at home: Medicines  Take over-the-counter and prescription medicines only as told by your health care provider. If you were prescribed an antibiotic medicine, take it as told by your health care provider. Do not stop taking the antibiotic even if you start to feel better. Abscess care  If you have an abscess that has not drained, apply heat to the affected area. Use the heat source that your health care provider recommends, such as a moist heat pack or a heating pad. Place a towel between your skin and the heat source. Leave the heat on for 20-30 minutes. Remove the heat if your skin turns bright red. This is especially important if you are unable to feel pain, heat, or cold. You may have a greater risk of getting burned. Follow instructions from your health care provider about how to take care of your abscess. Make sure you: Cover the abscess with a bandage (dressing). Change your dressing or gauze as told by your health care provider. Wash your hands with soap and water before you change the dressing or gauze. If soap and water are not available, use hand sanitizer. Check your abscess every day for signs of a worsening infection. Check for: More redness, swelling, or pain. More fluid or blood. Warmth.   More pus or a bad smell. General instructions To avoid spreading the infection: Do not share personal care items, towels, or hot tubs with others. Avoid making skin contact with other people. Keep all follow-up visits as told by your health care provider. This is important. Contact a health care provider if you have: More redness, swelling, or pain around your abscess. More fluid or blood coming from  your abscess. Warm skin around your abscess. More pus or a bad smell coming from your abscess. Muscle aches. Chills or a general ill feeling. Get help right away if you: Have severe pain. See red streaks on your skin spreading away from the abscess. See redness that spreads quickly. Have a fever or chills. Summary A skin abscess is an infected area on or under your skin that contains a collection of pus and other material. A small abscess that drains on its own may not need treatment. Treatment for larger abscesses may include having a procedure to drain the abscess and taking an antibiotic. This information is not intended to replace advice given to you by your health care provider. Make sure you discuss any questions you have with your health care provider. Document Revised: 11/18/2021 Document Reviewed: 05/24/2021 Elsevier Patient Education  2023 [employer] Inc[employer]  

## 2022-08-08 DIAGNOSIS — H524 Presbyopia: Secondary | ICD-10-CM | POA: Diagnosis not present

## 2022-08-12 ENCOUNTER — Ambulatory Visit (INDEPENDENT_AMBULATORY_CARE_PROVIDER_SITE_OTHER): Payer: Medicare Other | Admitting: Urology

## 2022-08-12 VITALS — BP 137/92 | HR 96 | Ht 78.0 in | Wt 330.0 lb

## 2022-08-12 DIAGNOSIS — T8141XA Infection following a procedure, superficial incisional surgical site, initial encounter: Secondary | ICD-10-CM

## 2022-08-12 MED ORDER — CLOTRIMAZOLE-BETAMETHASONE 1-0.05 % EX CREA: 1.0000 | TOPICAL_CREAM | Freq: Two times a day (BID) | CUTANEOUS | 0 refills | Status: DC

## 2022-08-12 MED ORDER — AMOXICILLIN-POT CLAVULANATE 875-125 MG PO TABS: 1.0000 | ORAL_TABLET | Freq: Two times a day (BID) | ORAL | 0 refills | Status: DC

## 2022-08-12 MED ORDER — BACITRACIN 500 UNIT/GM EX OINT: 1.0000 | TOPICAL_OINTMENT | Freq: Two times a day (BID) | CUTANEOUS | 0 refills | Status: DC

## 2022-08-12 NOTE — Progress Notes (Unsigned)
08/12/2022 11:10 AM   Ryan Lowe 08-07-1956 [id number]  Referring provider: Ignatius Specking, MD [address] [place],  [place] [address]  Followup stitch abscess   HPI: Ryan Lowe is a 15QM here for followup for a scrotal wound and stitch abscess. No drainage from incision. No pain. No swelling. No worsening LUTS. No other complaints today   PMH: Past Medical History:  Diagnosis Date   Atrial fibrillation (HCC)    Diabetes mellitus without complication (HCC)    Dysrhythmia    History of sinus surgery     Surgical History: Past Surgical History:  Procedure Laterality Date   COLON RESECTION  12/21/2020   removal of right colon   INCISION AND DRAINAGE ABSCESS N/A 06/17/2022   Procedure: INCISION AND DRAINAGE ABSCESS;  Surgeon: Malen Gauze, MD;  Location: [facility];  Service: Urology;  Laterality: N/A;   ORCHIECTOMY Left 11/19/2021   Procedure: ORCHIECTOMY;  Surgeon: Malen Gauze, MD;  Location: [facility];  Service: Urology;  Laterality: Left;   SCROTAL EXPLORATION Left 11/19/2021   Procedure: SCROTUM EXPLORATION- Incision and debridement;  Surgeon: Malen Gauze, MD;  Location: [facility];  Service: Urology;  Laterality: Left;   SCROTAL EXPLORATION N/A 06/17/2022   Procedure: SCROTUM EXPLORATION;  Surgeon: Malen Gauze, MD;  Location: [facility];  Service: Urology;  Laterality: N/A;    Home Medications:  Allergies as of 08/12/2022       Reactions   Tamsulosin Other (See Comments)   Abdominal bloating, nausea, fatigue, dysnea        Medication List        Accurate as of August 12, 2022 11:10 AM. If you have any questions, ask your nurse or doctor.          amoxicillin-clavulanate 875-125 MG tablet Commonly known as: AUGMENTIN Take 1 tablet by mouth every 12 (twelve) hours.   aspirin 325 MG tablet Take 325 mg by mouth daily.   bacitracin 500 UNIT/GM ointment Apply 1 application. topically 2 (two) times daily.   clotrimazole-betamethasone  cream Commonly known as: Lotrisone Apply 1 Application topically 2 (two) times daily.   clotrimazole-betamethasone cream Commonly known as: Lotrisone Apply 1 Application topically 2 (two) times daily.   diltiazem 300 MG 24 hr capsule Commonly known as: CARDIZEM CD Take 300 mg by mouth daily.   fish oil-omega-3 fatty acids 1000 MG capsule Take 1 capsule by mouth daily.   glipiZIDE 5 MG tablet Commonly known as: GLUCOTROL Take 5 mg by mouth daily.   One-A-Day Weight Smart Advance Tabs Take 1 tablet by mouth daily.   oxyCODONE-acetaminophen 5-325 MG tablet Commonly known as: Percocet Take 1 tablet by mouth every 4 (four) hours as needed for severe pain.   oxyCODONE-acetaminophen 7.5-325 MG tablet Commonly known as: Percocet Take 1 tablet by mouth every 4 (four) hours as needed for severe pain.        Allergies:  Allergies  Allergen Reactions   Tamsulosin Other (See Comments)    Abdominal bloating, nausea, fatigue, dysnea    Family History: Family History  Problem Relation Age of Onset   Heart disease Other    Heart failure Other     Social History:  reports that he has quit smoking. His smoking use included cigarettes. He quit smokeless tobacco use about 30 years ago.  His smokeless tobacco use included chew. He reports that he does not drink alcohol and does not use drugs.  ROS: All other review of systems were reviewed  and are negative except what is noted above in HPI  Physical Exam: BP (!) 137/92   Pulse 96   Ht 6\' 6"  (1.981 m)   Wt (!) 330 lb (149.7 kg)   BMI 38.14 kg/m   Constitutional:  Alert and oriented, No acute distress. HEENT: [place] AT, moist mucus membranes.  Trachea midline, no masses. Cardiovascular: No clubbing, cyanosis, or edema. Respiratory: Normal respiratory effort, no increased work of breathing. GI: Abdomen is soft, nontender, nondistended, no abdominal masses GU: No CVA tenderness.  Lymph: No cervical or inguinal  lymphadenopathy. Skin: No rashes, bruises or suspicious lesions. Neurologic: Grossly intact, no focal deficits, moving all 4 extremities. Psychiatric: Normal mood and affect.  Laboratory Data: Lab Results  Component Value Date   WBC 20.2 (H) 10/20/2021   HGB 13.7 10/20/2021   HCT 42.2 10/20/2021   MCV 92.1 10/20/2021   PLT 195 10/20/2021    Lab Results  Component Value Date   CREATININE 0.95 06/16/2022    No results found for: "PSA"  No results found for: "TESTOSTERONE"  Lab Results  Component Value Date   HGBA1C 6.4 (H) 06/16/2022    Urinalysis    Component Value Date/Time   COLORURINE YELLOW 10/20/2021 1616   APPEARANCEUR Clear 11/15/2021 1117   LABSPEC 1.023 10/20/2021 1616   PHURINE 5.0 10/20/2021 1616   GLUCOSEU Negative 11/15/2021 1117   HGBUR SMALL (A) 10/20/2021 1616   BILIRUBINUR Negative 11/15/2021 1117   KETONESUR NEGATIVE 10/20/2021 1616   PROTEINUR Negative 11/15/2021 1117   PROTEINUR 100 (A) 10/20/2021 1616   NITRITE Negative 11/15/2021 1117   NITRITE NEGATIVE 10/20/2021 1616   LEUKOCYTESUR Negative 11/15/2021 1117   LEUKOCYTESUR LARGE (A) 10/20/2021 1616    Lab Results  Component Value Date   LABMICR Comment 11/15/2021   BACTERIA RARE (A) 10/20/2021    Pertinent Imaging:  No results found for this or any previous visit.  No results found for this or any previous visit.  No results found for this or any previous visit.  No results found for this or any previous visit.  No results found for this or any previous visit.  No valid procedures specified. No results found for this or any previous visit.  No results found for this or any previous visit.   Assessment & Plan:    1. Infection involving stitch with abscess -continue lotramin BID and start augmentin 875 BID for 7 days   No follow-ups on file.  10/22/2021, MD  [facility] [place]

## 2022-08-18 ENCOUNTER — Encounter: Payer: Self-pay | Admitting: Urology

## 2022-08-18 NOTE — Patient Instructions (Signed)
Skin Abscess  A skin abscess is an infected area on or under your skin that contains a collection of pus and other material. An abscess may also be called a furuncle, carbuncle, or boil. An abscess can occur in or on almost any part of your body. Some abscesses break open (rupture) on their own. Most continue to get worse unless they are treated. The infection can spread deeper into the body and eventually into your blood, which can make you feel ill. Treatment usually involves draining the abscess. What are the causes? An abscess occurs when germs, like bacteria, pass through your skin and cause an infection. This may be caused by: A scrape or cut on your skin. A puncture wound through your skin, including a needle injection or insect bite. Blocked oil or sweat glands. Blocked and infected hair follicles. A cyst that forms beneath your skin (sebaceous cyst) and becomes infected. What increases the risk? This condition is more likely to develop in people who: Have a weak body defense system (immune system). Have diabetes. Have dry and irritated skin. Get frequent injections or use illegal IV drugs. Have a foreign body in a wound, such as a splinter. Have problems with their lymph system or veins. What are the signs or symptoms? Symptoms of this condition include: A painful, firm bump under the skin. A bump with pus at the top. This may break through the skin and drain. Other symptoms include: Redness surrounding the abscess site. Warmth. Swelling of the lymph nodes (glands) near the abscess. Tenderness. A sore on the skin. How is this diagnosed? This condition may be diagnosed based on: A physical exam. Your medical history. A sample of pus. This may be used to find out what is causing the infection. Blood tests. Imaging tests, such as an ultrasound, CT scan, or MRI. How is this treated? A small abscess that drains on its own may not need treatment. Treatment for larger abscesses  may include: Moist heat or heat pack applied to the area several times a day. A procedure to drain the abscess (incision and drainage). Antibiotic medicines. For a severe abscess, you may first get antibiotics through an IV and then change to antibiotics by mouth. Follow these instructions at home: Medicines  Take over-the-counter and prescription medicines only as told by your health care provider. If you were prescribed an antibiotic medicine, take it as told by your health care provider. Do not stop taking the antibiotic even if you start to feel better. Abscess care  If you have an abscess that has not drained, apply heat to the affected area. Use the heat source that your health care provider recommends, such as a moist heat pack or a heating pad. Place a towel between your skin and the heat source. Leave the heat on for 20-30 minutes. Remove the heat if your skin turns bright red. This is especially important if you are unable to feel pain, heat, or cold. You may have a greater risk of getting burned. Follow instructions from your health care provider about how to take care of your abscess. Make sure you: Cover the abscess with a bandage (dressing). Change your dressing or gauze as told by your health care provider. Wash your hands with soap and water before you change the dressing or gauze. If soap and water are not available, use hand sanitizer. Check your abscess every day for signs of a worsening infection. Check for: More redness, swelling, or pain. More fluid or blood. Warmth.   More pus or a bad smell. General instructions To avoid spreading the infection: Do not share personal care items, towels, or hot tubs with others. Avoid making skin contact with other people. Keep all follow-up visits as told by your health care provider. This is important. Contact a health care provider if you have: More redness, swelling, or pain around your abscess. More fluid or blood coming from  your abscess. Warm skin around your abscess. More pus or a bad smell coming from your abscess. Muscle aches. Chills or a general ill feeling. Get help right away if you: Have severe pain. See red streaks on your skin spreading away from the abscess. See redness that spreads quickly. Have a fever or chills. Summary A skin abscess is an infected area on or under your skin that contains a collection of pus and other material. A small abscess that drains on its own may not need treatment. Treatment for larger abscesses may include having a procedure to drain the abscess and taking an antibiotic. This information is not intended to replace advice given to you by your health care provider. Make sure you discuss any questions you have with your health care provider. Document Revised: 11/18/2021 Document Reviewed: 05/24/2021 Elsevier Patient Education  2023 [employer] Inc[employer]  

## 2022-09-19 ENCOUNTER — Ambulatory Visit: Payer: Medicare Other | Admitting: Urology

## 2022-10-21 DIAGNOSIS — E7800 Pure hypercholesterolemia, unspecified: Secondary | ICD-10-CM | POA: Diagnosis not present

## 2022-10-21 DIAGNOSIS — I4891 Unspecified atrial fibrillation: Secondary | ICD-10-CM | POA: Diagnosis not present

## 2022-10-21 DIAGNOSIS — E1165 Type 2 diabetes mellitus with hyperglycemia: Secondary | ICD-10-CM | POA: Diagnosis not present

## 2022-10-21 DIAGNOSIS — R0609 Other forms of dyspnea: Secondary | ICD-10-CM | POA: Diagnosis not present

## 2022-10-21 DIAGNOSIS — Z299 Encounter for prophylactic measures, unspecified: Secondary | ICD-10-CM | POA: Diagnosis not present

## 2022-10-25 ENCOUNTER — Ambulatory Visit (INDEPENDENT_AMBULATORY_CARE_PROVIDER_SITE_OTHER): Payer: Medicare Other | Admitting: Urology

## 2022-10-25 VITALS — BP 139/84 | HR 98

## 2022-10-25 DIAGNOSIS — T8141XA Infection following a procedure, superficial incisional surgical site, initial encounter: Secondary | ICD-10-CM | POA: Diagnosis not present

## 2022-10-25 MED ORDER — CLOTRIMAZOLE-BETAMETHASONE 1-0.05 % EX CREA: 1.0000 | TOPICAL_CREAM | Freq: Two times a day (BID) | CUTANEOUS | 0 refills | Status: DC

## 2022-10-25 MED ORDER — AMOXICILLIN-POT CLAVULANATE 875-125 MG PO TABS: 1.0000 | ORAL_TABLET | Freq: Two times a day (BID) | ORAL | 0 refills | Status: DC

## 2022-10-25 NOTE — Progress Notes (Unsigned)
10/25/2022 3:13 PM   Ryan Lowe 08-Feb-1956 [id number]  Referring provider: Glenda Chroman, MD [address] [place],  [place] [address]  No chief complaint on file.   HPI:    PMH: Past Medical History:  Diagnosis Date   Atrial fibrillation ([place])    Diabetes mellitus without complication ([place])    Dysrhythmia    History of sinus surgery     Surgical History: Past Surgical History:  Procedure Laterality Date   COLON RESECTION  12/21/2020   removal of right colon   INCISION AND DRAINAGE ABSCESS N/A 06/17/2022   Procedure: INCISION AND DRAINAGE ABSCESS;  Surgeon: Cleon Gustin, MD;  Location: [facility];  Service: Urology;  Laterality: N/A;   ORCHIECTOMY Left 11/19/2021   Procedure: ORCHIECTOMY;  Surgeon: Cleon Gustin, MD;  Location: [facility];  Service: Urology;  Laterality: Left;   SCROTAL EXPLORATION Left 11/19/2021   Procedure: SCROTUM EXPLORATION- Incision and debridement;  Surgeon: Cleon Gustin, MD;  Location: [facility];  Service: Urology;  Laterality: Left;   SCROTAL EXPLORATION N/A 06/17/2022   Procedure: SCROTUM EXPLORATION;  Surgeon: Cleon Gustin, MD;  Location: [facility];  Service: Urology;  Laterality: N/A;    Home Medications:  Allergies as of 10/25/2022       Reactions   Tamsulosin Other (See Comments)   Abdominal bloating, nausea, fatigue, dysnea        Medication List        Accurate as of October 25, 2022  3:13 PM. If you have any questions, ask your nurse or doctor.          amoxicillin-clavulanate 875-125 MG tablet Commonly known as: AUGMENTIN Take 1 tablet by mouth every 12 (twelve) hours.   aspirin 325 MG tablet Take 325 mg by mouth daily.   bacitracin 500 UNIT/GM ointment Apply 1 Application topically 2 (two) times daily.   clotrimazole-betamethasone cream Commonly known as: Lotrisone Apply 1 Application topically 2 (two) times daily.   clotrimazole-betamethasone cream Commonly known as: Lotrisone Apply 1 Application  topically 2 (two) times daily.   diltiazem 300 MG 24 hr capsule Commonly known as: CARDIZEM CD Take 300 mg by mouth daily.   fish oil-omega-3 fatty acids 1000 MG capsule Take 1 capsule by mouth daily.   glipiZIDE 5 MG tablet Commonly known as: GLUCOTROL Take 5 mg by mouth daily.   One-A-Day Weight Smart Advance Tabs Take 1 tablet by mouth daily.   oxyCODONE-acetaminophen 5-325 MG tablet Commonly known as: Percocet Take 1 tablet by mouth every 4 (four) hours as needed for severe pain.   oxyCODONE-acetaminophen 7.5-325 MG tablet Commonly known as: Percocet Take 1 tablet by mouth every 4 (four) hours as needed for severe pain.        Allergies:  Allergies  Allergen Reactions   Tamsulosin Other (See Comments)    Abdominal bloating, nausea, fatigue, dysnea    Family History: Family History  Problem Relation Age of Onset   Heart disease Other    Heart failure Other     Social History:  reports that he has quit smoking. His smoking use included cigarettes. He quit smokeless tobacco use about 31 years ago.  His smokeless tobacco use included chew. He reports that he does not drink alcohol and does not use drugs.  ROS: All other review of systems were reviewed and are negative except what is noted above in HPI  Physical Exam: BP 139/84   Pulse 98   Constitutional:  Alert and oriented,  No acute distress. HEENT: [place] AT, moist mucus membranes.  Trachea midline, no masses. Cardiovascular: No clubbing, cyanosis, or edema. Respiratory: Normal respiratory effort, no increased work of breathing. GI: Abdomen is soft, nontender, nondistended, no abdominal masses GU: No CVA tenderness.  Lymph: No cervical or inguinal lymphadenopathy. Skin: No rashes, bruises or suspicious lesions. Neurologic: Grossly intact, no focal deficits, moving all 4 extremities. Psychiatric: Normal mood and affect.  Laboratory Data: Lab Results  Component Value Date   WBC 20.2 (H) 10/20/2021   HGB  13.7 10/20/2021   HCT 42.2 10/20/2021   MCV 92.1 10/20/2021   PLT 195 10/20/2021    Lab Results  Component Value Date   CREATININE 0.95 06/16/2022    No results found for: "PSA"  No results found for: "TESTOSTERONE"  Lab Results  Component Value Date   HGBA1C 6.4 (H) 06/16/2022    Urinalysis    Component Value Date/Time   COLORURINE YELLOW 10/20/2021 1616   APPEARANCEUR Clear 11/15/2021 1117   LABSPEC 1.023 10/20/2021 1616   PHURINE 5.0 10/20/2021 1616   GLUCOSEU Negative 11/15/2021 1117   HGBUR SMALL (A) 10/20/2021 1616   BILIRUBINUR Negative 11/15/2021 1117   KETONESUR NEGATIVE 10/20/2021 1616   PROTEINUR Negative 11/15/2021 1117   PROTEINUR 100 (A) 10/20/2021 1616   NITRITE Negative 11/15/2021 1117   NITRITE NEGATIVE 10/20/2021 1616   LEUKOCYTESUR Negative 11/15/2021 1117   LEUKOCYTESUR LARGE (A) 10/20/2021 1616    Lab Results  Component Value Date   LABMICR Comment 11/15/2021   BACTERIA RARE (A) 10/20/2021    Pertinent Imaging: *** No results found for this or any previous visit.  No results found for this or any previous visit.  No results found for this or any previous visit.  No results found for this or any previous visit.  No results found for this or any previous visit.  No valid procedures specified. No results found for this or any previous visit.  No results found for this or any previous visit.   Assessment & Plan:    1. Infection involving stitch with abscess *** - Urinalysis, Routine w reflex microscopic   No follow-ups on file.  Nicolette Bang, MD  [facility] [place]

## 2022-10-27 ENCOUNTER — Encounter: Payer: Self-pay | Admitting: Urology

## 2022-10-27 NOTE — Patient Instructions (Signed)
Skin Abscess  A skin abscess is an infected area on or under your skin. It contains pus and other material. An abscess may also be called a furuncle, carbuncle, or boil. It is often the result of an infection caused by bacteria. An abscess can occur in or on almost any part of your body. Sometimes, an abscess may break open (rupture) on its own. In most cases, it will keep getting worse unless it is treated. An abscess can cause pain and make you feel ill. An untreated abscess can cause infection to spread to other parts of your body or your bloodstream. The abscess may need to be drained. You may also need to take antibiotics. What are the causes? An abscess occurs when germs, like bacteria, pass through your skin and cause an infection. This may be caused by: A scrape or cut on your skin. A puncture wound through your skin, such as a needle injection or insect bite. Blocked oil or sweat glands. Blocked and infected hair follicles. A fluid-filled sac that forms beneath your skin (sebaceous cyst) and becomes infected. What increases the risk? You may be more likely to develop an abscess if: You have problems with blood circulation, or you have a weak body defense system (immune system). You have diabetes. You have dry and irritated skin. You get injections often or use IV drugs. You have a foreign body in a wound, such as a splinter. You smoke or use tobacco products. What are the signs or symptoms? Symptoms of this condition include: A painful, firm bump under the skin. A bump with pus at the top. This may break through the skin and drain. Other symptoms include: Redness and swelling around the abscess. Warmth or tenderness. Swelling of the lymph nodes (glands) near the abscess. A sore on the skin. How is this diagnosed? This condition may be diagnosed based on a physical exam and your medical history. You may also have tests done, such as: A test of a sample of pus. This may be done  to find what is causing the infection. Blood tests. Imaging tests, such as an ultrasound, CT scan, or MRI. How is this treated? A small abscess that drains on its own may not need to be treated. Treatment for larger abscesses may include: Moist heat or a heat pack applied to the area a few times a day. Incision and drainage. This is a procedure to drain the abscess. Antibiotics. For a severe abscess, you may first get antibiotics through an IV and then change to antibiotics by mouth. Follow these instructions at home: Medicines Take over-the-counter and prescription medicines only as told by your provider. If you were prescribed antibiotics, take them as told by your provider. Do not stop using the antibiotic even if you start to feel better. Abscess care  If you have an abscess that has not drained, apply heat to the affected area. Use the heat source that your provider recommends, such as a moist heat pack or a heating pad. Place a towel between your skin and the heat source. Leave the heat on for 20-30 minutes at a time. If your skin turns bright red, remove the heat right away to prevent burns. The risk of burns is higher if you cannot feel pain, heat, or cold. Follow instructions from your provider about how to take care of your abscess. Make sure you: Cover the abscess with a bandage (dressing). Wash your hands with soap and water for at least 20 seconds before  and after you change the dressing or gauze. If soap and water are not available, use hand sanitizer. Change your dressing or gauze as told by your provider. Check your abscess every day for signs of an infection that is getting worse. Check for: More redness, swelling, pain, or tenderness. More fluid or blood. Warmth. More pus or a worse smell. General instructions To avoid spreading the infection: Do not share personal care items, towels, or hot tubs with others. Avoid making skin contact with other people. Be careful  when getting rid of used dressings, wound packing, or any drainage from the abscess. Do not use any products that contain nicotine or tobacco. These products include cigarettes, chewing tobacco, and vaping devices, such as e-cigarettes. If you need help quitting, ask your provider. Do not use any creams, ointments, or liquids unless you have been told to by your provider. Contact a health care provider if: You see redness that spreads quickly or red streaks on your skin spreading away from the abscess. You have any signs of worse infection at the abscess. You vomit every time you eat or drink. You have a fever, chills, or muscle aches. The cyst or abscess returns. Get help right away if: You have severe pain. You make less pee (urine) than normal. This information is not intended to replace advice given to you by your health care provider. Make sure you discuss any questions you have with your health care provider. Document Revised: 03/30/2022 Document Reviewed: 03/30/2022 Elsevier Patient Education  [place].

## 2023-01-27 ENCOUNTER — Ambulatory Visit (INDEPENDENT_AMBULATORY_CARE_PROVIDER_SITE_OTHER): Payer: Medicare Other | Admitting: Urology

## 2023-01-27 ENCOUNTER — Encounter: Payer: Self-pay | Admitting: Urology

## 2023-01-27 VITALS — BP 138/78 | HR 116

## 2023-01-27 DIAGNOSIS — I471 Supraventricular tachycardia, unspecified: Secondary | ICD-10-CM | POA: Diagnosis not present

## 2023-01-27 DIAGNOSIS — Z87891 Personal history of nicotine dependence: Secondary | ICD-10-CM | POA: Diagnosis not present

## 2023-01-27 DIAGNOSIS — Z0000 Encounter for general adult medical examination without abnormal findings: Secondary | ICD-10-CM | POA: Diagnosis not present

## 2023-01-27 DIAGNOSIS — Z299 Encounter for prophylactic measures, unspecified: Secondary | ICD-10-CM | POA: Diagnosis not present

## 2023-01-27 DIAGNOSIS — Z7189 Other specified counseling: Secondary | ICD-10-CM | POA: Diagnosis not present

## 2023-01-27 DIAGNOSIS — T8141XA Infection following a procedure, superficial incisional surgical site, initial encounter: Secondary | ICD-10-CM | POA: Diagnosis not present

## 2023-01-27 DIAGNOSIS — I700 Atherosclerosis of aorta: Secondary | ICD-10-CM | POA: Diagnosis not present

## 2023-01-27 MED ORDER — CLOTRIMAZOLE-BETAMETHASONE 1-0.05 % EX CREA: 1.0000 | TOPICAL_CREAM | Freq: Two times a day (BID) | CUTANEOUS | 3 refills | Status: DC

## 2023-01-27 MED ORDER — CLOTRIMAZOLE-BETAMETHASONE 1-0.05 % EX CREA: 1.0000 | TOPICAL_CREAM | Freq: Two times a day (BID) | CUTANEOUS | 0 refills | Status: DC

## 2023-01-27 MED ORDER — AMOXICILLIN-POT CLAVULANATE 875-125 MG PO TABS: 1.0000 | ORAL_TABLET | Freq: Two times a day (BID) | ORAL | 0 refills | Status: DC

## 2023-01-27 NOTE — Progress Notes (Signed)
01/27/2023 11:05 AM   Laurine Blazer May 22, 1956 [id number]  Referring provider: Ignatius Specking, MD [address] [place],  [place] [address]  Followup scrotal wound   HPI: Ryan Lowe is a 67yo here for followup for a scrotal wound. He denies any drainage, erythema, induration of the area. He applies clotrimazole prn for balanitis. NO significant LUTS. No other complaints today   PMH: Past Medical History:  Diagnosis Date   Atrial fibrillation ([facility])    Diabetes mellitus without complication ([facility])    Dysrhythmia    History of sinus surgery     Surgical History: Past Surgical History:  Procedure Laterality Date   COLON RESECTION  12/21/2020   removal of right colon   INCISION AND DRAINAGE ABSCESS N/A 06/17/2022   Procedure: INCISION AND DRAINAGE ABSCESS;  Surgeon: Malen Gauze, MD;  Location: [facility];  Service: Urology;  Laterality: N/A;   ORCHIECTOMY Left 11/19/2021   Procedure: ORCHIECTOMY;  Surgeon: Malen Gauze, MD;  Location: [facility];  Service: Urology;  Laterality: Left;   SCROTAL EXPLORATION Left 11/19/2021   Procedure: SCROTUM EXPLORATION- Incision and debridement;  Surgeon: Malen Gauze, MD;  Location: [facility];  Service: Urology;  Laterality: Left;   SCROTAL EXPLORATION N/A 06/17/2022   Procedure: SCROTUM EXPLORATION;  Surgeon: Malen Gauze, MD;  Location: [facility];  Service: Urology;  Laterality: N/A;    Home Medications:  Allergies as of 01/27/2023       Reactions   Tamsulosin Other (See Comments)   Abdominal bloating, nausea, fatigue, dysnea        Medication List        Accurate as of Jan 27, 2023 11:05 AM. If you have any questions, ask your nurse or doctor.          amoxicillin-clavulanate 875-125 MG tablet Commonly known as: AUGMENTIN Take 1 tablet by mouth every 12 (twelve) hours.   aspirin 325 MG tablet Take 325 mg by mouth daily.   bacitracin 500 UNIT/GM ointment Apply 1 Application topically 2 (two) times daily.    clotrimazole-betamethasone cream Commonly known as: Lotrisone Apply 1 Application topically 2 (two) times daily.   clotrimazole-betamethasone cream Commonly known as: Lotrisone Apply 1 Application topically 2 (two) times daily.   diltiazem 300 MG 24 hr capsule Commonly known as: CARDIZEM CD Take 300 mg by mouth daily.   fish oil-omega-3 fatty acids 1000 MG capsule Take 1 capsule by mouth daily.   glipiZIDE 5 MG tablet Commonly known as: GLUCOTROL Take 5 mg by mouth daily.   One-A-Day Weight Smart Advance Tabs Take 1 tablet by mouth daily.   oxyCODONE-acetaminophen 5-325 MG tablet Commonly known as: Percocet Take 1 tablet by mouth every 4 (four) hours as needed for severe pain.   oxyCODONE-acetaminophen 7.5-325 MG tablet Commonly known as: Percocet Take 1 tablet by mouth every 4 (four) hours as needed for severe pain.        Allergies:  Allergies  Allergen Reactions   Tamsulosin Other (See Comments)    Abdominal bloating, nausea, fatigue, dysnea    Family History: Family History  Problem Relation Age of Onset   Heart disease Other    Heart failure Other     Social History:  reports that he has quit smoking. His smoking use included cigarettes. He quit smokeless tobacco use about 31 years ago.  His smokeless tobacco use included chew. He reports that he does not drink alcohol and does not use drugs.  ROS: All other review  of systems were reviewed and are negative except what is noted above in HPI  Physical Exam: BP 138/78   Pulse (!) 116   Constitutional:  Alert and oriented, No acute distress. HEENT: [place] AT, moist mucus membranes.  Trachea midline, no masses. Cardiovascular: No clubbing, cyanosis, or edema. Respiratory: Normal respiratory effort, no increased work of breathing. GI: Abdomen is soft, nontender, nondistended, no abdominal masses GU: No CVA tenderness.  Lymph: No cervical or inguinal lymphadenopathy. Skin: No rashes, bruises or suspicious  lesions. Neurologic: Grossly intact, no focal deficits, moving all 4 extremities. Psychiatric: Normal mood and affect.  Laboratory Data: Lab Results  Component Value Date   WBC 20.2 (H) 10/20/2021   HGB 13.7 10/20/2021   HCT 42.2 10/20/2021   MCV 92.1 10/20/2021   PLT 195 10/20/2021    Lab Results  Component Value Date   CREATININE 0.95 06/16/2022    No results found for: "PSA"  No results found for: "TESTOSTERONE"  Lab Results  Component Value Date   HGBA1C 6.4 (H) 06/16/2022    Urinalysis    Component Value Date/Time   COLORURINE YELLOW 10/20/2021 1616   APPEARANCEUR Clear 11/15/2021 1117   LABSPEC 1.023 10/20/2021 1616   PHURINE 5.0 10/20/2021 1616   GLUCOSEU Negative 11/15/2021 1117   HGBUR SMALL (A) 10/20/2021 1616   BILIRUBINUR Negative 11/15/2021 1117   KETONESUR NEGATIVE 10/20/2021 1616   PROTEINUR Negative 11/15/2021 1117   PROTEINUR 100 (A) 10/20/2021 1616   NITRITE Negative 11/15/2021 1117   NITRITE NEGATIVE 10/20/2021 1616   LEUKOCYTESUR Negative 11/15/2021 1117   LEUKOCYTESUR LARGE (A) 10/20/2021 1616    Lab Results  Component Value Date   LABMICR Comment 11/15/2021   BACTERIA RARE (A) 10/20/2021    Pertinent Imaging:  No results found for this or any previous visit.  No results found for this or any previous visit.  No results found for this or any previous visit.  No results found for this or any previous visit.  No results found for this or any previous visit.  No valid procedures specified. No results found for this or any previous visit.  No results found for this or any previous visit.   Assessment & Plan:    1. Infection involving stitch with abscess -contineu clotrimazole BID prn -followup 6 months   No follow-ups on file.  Wilkie Aye, MD  [facility] [place]

## 2023-01-27 NOTE — Patient Instructions (Signed)
Balanitis  Balanitis is swelling and irritation of the head of the penis (glans penis). Balanitis occurs most often among males who have not had their foreskin removed (uncircumcised). In uncircumcised males, the condition may also cause inflammation of the skin around the foreskin. Balanitis sometimes causes scarring of the penis or foreskin, which can require surgery. This condition may develop because of an infection or another medical condition. Untreated balanitis can increase the risk of penile cancer. What are the causes? Common causes of this condition include: Irritation and lack of airflow due to fluid (smegma) that can build up on the glans penis. Poor personal hygiene, especially in uncircumcised males. Not cleaning the glans penis and foreskin well can result in a buildup of bacteria, viruses, and yeast, which can lead to infection and inflammation. Other causes include: Chemical irritation from products such as soaps or shower gels, especially those that have fragrance. Chemical irritation can also be caused by condoms, personal lubricants, petroleum jelly, spermicides, fabric softeners, or laundry detergents. Skin conditions, such as eczema, dermatitis, and psoriasis. Allergies to medicines, such as tetracycline and sulfa drugs. What increases the risk? The following factors may make you more likely to develop this condition: Being an uncircumcised male. Having diabetes. Having other medical conditions, including liver cirrhosis, congestive heart failure, or kidney disease. Having infections, such as candidiasis, HPV (human papillomavirus), herpes simplex, gonorrhea, or syphilis. Having a tight foreskin that is difficult to pull back (retract) past the glans penis. Being severely obese. History of reactive arthritis. What are the signs or symptoms? Symptoms of this condition include: Discharge from under the foreskin, and pain or difficulty retracting the foreskin. A bad smell  or itchiness on the penis. Tenderness, redness, and swelling of the glans penis. A rash or sores on the glans penis or foreskin. Inability to get an erection due to pain. Trouble urinating. Scarring of the penis or foreskin, in some cases. How is this diagnosed? This condition may be diagnosed based on a physical exam and tests of a swab of discharge to check for bacterial or fungal infection. You may also have blood tests to check for: Viruses that can cause balanitis. A high blood sugar (glucose) level. This could be a sign of diabetes, which can increase the risk of balanitis. How is this treated? Treatment for this condition depends on the cause. Treatment may include: Improving personal hygiene. Your health care provider may recommend sitting in a bath of warm water that is deep enough to cover your hips and buttocks (sitz bath). Medicines such as: Creams or ointments to reduce swelling (steroids) or to treat an infection. Antibiotic medicine. Antifungal medicine. Having surgery to remove or cut the foreskin (circumcision). This may be done if you have scarring on the foreskin that makes it difficult to retract. Controlling other medical problems that may be causing your condition or making it worse. Follow these instructions at home: Medicines Take over-the-counter and prescription medicines only as told by your health care provider. If you were prescribed an antibiotic medicine, use it as told by your health care provider. Do not stop using the antibiotic even if you start to feel better. General instructions Do not have sex until the condition clears up, or until your health care provider approves. Keep your penis clean and dry. Take sitz baths as recommended by your health care provider. Avoid products that irritate your skin or make symptoms worse, such as soaps and shower gels that have fragrance. Keep all follow-up visits. This is  important. Contact a health care provider  if: Your symptoms get worse or do not improve with home care. You develop chills or a fever. You have trouble urinating. You cannot retract your foreskin. Get help right away if: You develop severe pain. You are unable to urinate. Summary Balanitis is swelling and irritation of the head of the penis (glans penis). This condition is most common among uncircumcised males. Balanitis causes pain, redness, and swelling of the glans penis. Good personal hygiene is important. Treatment may include improving personal hygiene and applying creams or ointments. Contact a health care provider if your symptoms get worse or do not improve with home care. This information is not intended to replace advice given to you by your health care provider. Make sure you discuss any questions you have with your health care provider. Document Revised: 01/27/2021 Document Reviewed: 01/27/2021 Elsevier Patient Education  2024 [employer].

## 2023-02-24 ENCOUNTER — Ambulatory Visit: Payer: Medicare Other | Admitting: Urology

## 2023-03-10 ENCOUNTER — Other Ambulatory Visit: Payer: Self-pay

## 2023-03-10 MED ORDER — CLOTRIMAZOLE-BETAMETHASONE 1-0.05 % EX CREA: 1.0000 | TOPICAL_CREAM | Freq: Two times a day (BID) | CUTANEOUS | 0 refills | Status: DC

## 2023-03-10 MED ORDER — AMOXICILLIN-POT CLAVULANATE 875-125 MG PO TABS: 1.0000 | ORAL_TABLET | Freq: Two times a day (BID) | ORAL | 0 refills | Status: DC

## 2023-05-18 ENCOUNTER — Other Ambulatory Visit: Payer: Self-pay

## 2023-05-18 DIAGNOSIS — I700 Atherosclerosis of aorta: Secondary | ICD-10-CM | POA: Diagnosis not present

## 2023-05-18 DIAGNOSIS — Z299 Encounter for prophylactic measures, unspecified: Secondary | ICD-10-CM | POA: Diagnosis not present

## 2023-05-18 DIAGNOSIS — Z0000 Encounter for general adult medical examination without abnormal findings: Secondary | ICD-10-CM | POA: Diagnosis not present

## 2023-05-18 DIAGNOSIS — Z87891 Personal history of nicotine dependence: Secondary | ICD-10-CM | POA: Diagnosis not present

## 2023-05-18 DIAGNOSIS — E1165 Type 2 diabetes mellitus with hyperglycemia: Secondary | ICD-10-CM | POA: Diagnosis not present

## 2023-05-18 DIAGNOSIS — E7800 Pure hypercholesterolemia, unspecified: Secondary | ICD-10-CM | POA: Diagnosis not present

## 2023-05-18 DIAGNOSIS — Z79899 Other long term (current) drug therapy: Secondary | ICD-10-CM | POA: Diagnosis not present

## 2023-05-18 DIAGNOSIS — Z2821 Immunization not carried out because of patient refusal: Secondary | ICD-10-CM | POA: Diagnosis not present

## 2023-05-18 DIAGNOSIS — R5383 Other fatigue: Secondary | ICD-10-CM | POA: Diagnosis not present

## 2023-05-18 MED ORDER — CLOTRIMAZOLE-BETAMETHASONE 1-0.05 % EX CREA: 1.0000 | TOPICAL_CREAM | Freq: Two times a day (BID) | CUTANEOUS | 0 refills | Status: DC

## 2023-06-27 ENCOUNTER — Telehealth: Payer: Self-pay

## 2023-06-27 MED ORDER — CLOTRIMAZOLE-BETAMETHASONE 1-0.05 % EX CREA: 1.0000 | TOPICAL_CREAM | Freq: Two times a day (BID) | CUTANEOUS | 3 refills | Status: DC

## 2023-06-27 NOTE — Telephone Encounter (Signed)
Refill submitted per patient request via triage message.

## 2023-07-06 ENCOUNTER — Other Ambulatory Visit: Payer: Self-pay

## 2023-07-06 ENCOUNTER — Telehealth: Payer: Self-pay | Admitting: Urology

## 2023-07-06 MED ORDER — CLOTRIMAZOLE-BETAMETHASONE 1-0.05 % EX CREA: 1.0000 | TOPICAL_CREAM | Freq: Two times a day (BID) | CUTANEOUS | 3 refills | Status: DC

## 2023-07-06 NOTE — Telephone Encounter (Signed)
clotrimazole-betamethasone (LOTRISONE) cream [[id number]]    Please send refill to [facility] in [place]

## 2023-07-06 NOTE — Telephone Encounter (Addendum)
Rx sent to [facility] per pt request 

## 2023-07-26 ENCOUNTER — Ambulatory Visit: Payer: Medicare Other | Admitting: Urology

## 2023-08-02 ENCOUNTER — Ambulatory Visit: Payer: Medicare Other | Admitting: Urology

## 2023-08-15 ENCOUNTER — Other Ambulatory Visit: Payer: Self-pay

## 2023-08-15 ENCOUNTER — Telehealth: Payer: Self-pay | Admitting: Urology

## 2023-08-15 NOTE — Telephone Encounter (Signed)
Pt wife called requesting Rx refill for clotrimazole cream and amoxicillin. Script sent to updated pharmacy. Amoxicillin refill request was sent ot MD

## 2023-08-15 NOTE — Telephone Encounter (Signed)
Needs cream and antibiotic switched to [facility], they have switched. He is out of both and needs asap.

## 2023-08-16 MED ORDER — AMOXICILLIN-POT CLAVULANATE 875-125 MG PO TABS: 1.0000 | ORAL_TABLET | Freq: Two times a day (BID) | ORAL | 0 refills | Status: DC

## 2023-08-17 DIAGNOSIS — E1165 Type 2 diabetes mellitus with hyperglycemia: Secondary | ICD-10-CM | POA: Diagnosis not present

## 2023-09-19 DIAGNOSIS — E1165 Type 2 diabetes mellitus with hyperglycemia: Secondary | ICD-10-CM | POA: Diagnosis not present

## 2023-09-19 DIAGNOSIS — Z299 Encounter for prophylactic measures, unspecified: Secondary | ICD-10-CM | POA: Diagnosis not present

## 2023-09-19 DIAGNOSIS — I700 Atherosclerosis of aorta: Secondary | ICD-10-CM | POA: Diagnosis not present

## 2023-09-19 DIAGNOSIS — I4891 Unspecified atrial fibrillation: Secondary | ICD-10-CM | POA: Diagnosis not present

## 2023-09-19 DIAGNOSIS — I471 Supraventricular tachycardia, unspecified: Secondary | ICD-10-CM | POA: Diagnosis not present

## 2023-09-19 DIAGNOSIS — D696 Thrombocytopenia, unspecified: Secondary | ICD-10-CM | POA: Diagnosis not present

## 2023-09-25 ENCOUNTER — Ambulatory Visit: Payer: Medicare Other | Admitting: Urology

## 2023-09-25 VITALS — BP 138/79 | HR 109

## 2023-09-25 DIAGNOSIS — B3789 Other sites of candidiasis: Secondary | ICD-10-CM | POA: Diagnosis not present

## 2023-09-25 DIAGNOSIS — T8141XA Infection following a procedure, superficial incisional surgical site, initial encounter: Secondary | ICD-10-CM

## 2023-09-25 LAB — URINALYSIS, ROUTINE W REFLEX MICROSCOPIC
Bilirubin, UA: NEGATIVE
Glucose, UA: NEGATIVE
Ketones, UA: NEGATIVE
Leukocytes,UA: NEGATIVE
Nitrite, UA: NEGATIVE
Protein,UA: NEGATIVE
RBC, UA: NEGATIVE
Specific Gravity, UA: 1.02 (ref 1.005–1.030)
Urobilinogen, Ur: 0.2 mg/dL (ref 0.2–1.0)
pH, UA: 6 (ref 5.0–7.5)

## 2023-09-25 MED ORDER — CLOTRIMAZOLE-BETAMETHASONE 1-0.05 % EX CREA: 1.0000 | TOPICAL_CREAM | Freq: Two times a day (BID) | CUTANEOUS | 3 refills | Status: DC

## 2023-09-25 MED ORDER — AMOXICILLIN-POT CLAVULANATE 875-125 MG PO TABS: 1.0000 | ORAL_TABLET | Freq: Two times a day (BID) | ORAL | 3 refills | Status: DC

## 2023-09-25 NOTE — Progress Notes (Signed)
09/25/2023 11:38 AM   Ryan Lowe [birth year] [id number]  Referring provider: Ignatius Specking, MD [address] [place],  [place] [address]  Groin wound  HPI: Ryan Lowe is a 67yo here for followup for a chronic groin wound.He noted his left groin wound started draining a couple of months ago. He has been putting clotrimazole on his wound.    PMH: Past Medical History:  Diagnosis Date   Atrial fibrillation (HCC)    Diabetes mellitus without complication (HCC)    Dysrhythmia    History of sinus surgery     Surgical History: Past Surgical History:  Procedure Laterality Date   COLON RESECTION  12/21/2020   removal of right colon   INCISION AND DRAINAGE ABSCESS N/A 06/17/2022   Procedure: INCISION AND DRAINAGE ABSCESS;  Surgeon: Ryan Gauze, MD;  Location: [facility];  Service: Urology;  Laterality: N/A;   ORCHIECTOMY Left 11/19/2021   Procedure: ORCHIECTOMY;  Surgeon: Ryan Gauze, MD;  Location: [facility];  Service: Urology;  Laterality: Left;   SCROTAL EXPLORATION Left 11/19/2021   Procedure: SCROTUM EXPLORATION- Incision and debridement;  Surgeon: Ryan Gauze, MD;  Location: [facility];  Service: Urology;  Laterality: Left;   SCROTAL EXPLORATION N/A 06/17/2022   Procedure: SCROTUM EXPLORATION;  Surgeon: Ryan Gauze, MD;  Location: [facility];  Service: Urology;  Laterality: N/A;    Home Medications:  Allergies as of 09/25/2023       Reactions   Tamsulosin Other (See Comments)   Abdominal bloating, nausea, fatigue, dysnea        Medication List        Accurate as of September 25, 2023 11:38 AM. If you have any questions, ask your nurse or doctor.          amoxicillin-clavulanate 875-125 MG tablet Commonly known as: AUGMENTIN Take 1 tablet by mouth every 12 (twelve) hours.   aspirin 325 MG tablet Take 325 mg by mouth daily.   bacitracin 500 UNIT/GM ointment Apply 1 Application topically 2 (two) times daily.   clotrimazole-betamethasone  cream Commonly known as: Lotrisone Apply 1 Application topically 2 (two) times daily.   diltiazem 300 MG 24 hr capsule Commonly known as: CARDIZEM CD Take 300 mg by mouth daily.   fish oil-omega-3 fatty acids 1000 MG capsule Take 1 capsule by mouth daily.   glipiZIDE 5 MG tablet Commonly known as: GLUCOTROL Take 5 mg by mouth daily.   One-A-Day Weight Smart Advance Tabs Take 1 tablet by mouth daily.   oxyCODONE-acetaminophen 7.5-325 MG tablet Commonly known as: Percocet Take 1 tablet by mouth every 4 (four) hours as needed for severe pain.        Allergies:  Allergies  Allergen Reactions   Tamsulosin Other (See Comments)    Abdominal bloating, nausea, fatigue, dysnea    Family History: Family History  Problem Relation Age of Onset   Heart disease Other    Heart failure Other     Social History:  reports that he has quit smoking. His smoking use included cigarettes. He quit smokeless tobacco use about 32 years ago.  His smokeless tobacco use included chew. He reports that he does not drink alcohol and does not use drugs.  ROS: All other review of systems were reviewed and are negative except what is noted above in HPI  Physical Exam: BP 138/79   Pulse (!) 109   Constitutional:  Alert and oriented, No acute distress. HEENT: [place] AT, moist mucus membranes.  Trachea  midline, no masses. Cardiovascular: No clubbing, cyanosis, or edema. Respiratory: Normal respiratory effort, no increased work of breathing. GI: Abdomen is soft, nontender, nondistended, no abdominal masses GU: No CVA tenderness.  Lymph: No cervical or inguinal lymphadenopathy. Skin: No rashes, bruises or suspicious lesions. Neurologic: Grossly intact, no focal deficits, moving all 4 extremities. Psychiatric: Normal mood and affect.  Laboratory Data: Lab Results  Component Value Date   WBC 20.2 (H) 10/20/2021   HGB 13.7 10/20/2021   HCT 42.2 10/20/2021   MCV 92.1 10/20/2021   PLT 195 10/20/2021     Lab Results  Component Value Date   CREATININE 0.95 06/16/2022    No results found for: "PSA"  No results found for: "TESTOSTERONE"  Lab Results  Component Value Date   HGBA1C 6.4 (H) 06/16/2022    Urinalysis    Component Value Date/Time   COLORURINE YELLOW 10/20/2021 1616   APPEARANCEUR Clear 11/15/2021 1117   LABSPEC 1.023 10/20/2021 1616   PHURINE 5.0 10/20/2021 1616   GLUCOSEU Negative 11/15/2021 1117   HGBUR SMALL (A) 10/20/2021 1616   BILIRUBINUR Negative 11/15/2021 1117   KETONESUR NEGATIVE 10/20/2021 1616   PROTEINUR Negative 11/15/2021 1117   PROTEINUR 100 (A) 10/20/2021 1616   NITRITE Negative 11/15/2021 1117   NITRITE NEGATIVE 10/20/2021 1616   LEUKOCYTESUR Negative 11/15/2021 1117   LEUKOCYTESUR LARGE (A) 10/20/2021 1616    Lab Results  Component Value Date   LABMICR Comment 11/15/2021   BACTERIA RARE (A) 10/20/2021    Pertinent Imaging:  No results found for this or any previous visit.  No results found for this or any previous visit.  No results found for this or any previous visit.  No results found for this or any previous visit.  No results found for this or any previous visit.  No results found for this or any previous visit.  No results found for this or any previous visit.  No results found for this or any previous visit.   Assessment & Plan:    1. Infection involving stitch with abscess (Primary) Augmentin 85mg  BID for 7 days  2. Candida rash of groin Clotrimazole BID for 21 days   No follow-ups on file.  Ryan Aye, MD  [facility] [place]

## 2023-09-29 ENCOUNTER — Encounter: Payer: Self-pay | Admitting: Urology

## 2023-09-29 NOTE — Patient Instructions (Signed)
Skin Abscess  A skin abscess is an infected spot of skin. It can have pus in it. An abscess can happen in any part of your body. Some abscesses break open (rupture) on their own. Most keep getting worse unless they are treated. If your abscess is not treated, the infection can spread deeper into your body and blood. This can make you feel sick. What are the causes? Germs that enter your skin. This may happen if you have: A cut or scrape. A wound from a needle or an insect bite. Blocked oil or sweat glands. A problem with the spot where your hair goes into your skin. A fluid-filled sac called a cyst under your skin. What increases the risk? Having problems with how your blood moves through your body. Having a weak body defense system (immune system). Having diabetes. Having dry and irritated skin. Needing to get shots often. Putting drugs into your body with a needle. Having a splinter or something else in your skin. Smoking. What are the signs or symptoms? A firm bump under your skin that hurts. A bump with pus at the top. Redness and swelling. Warm or tender spots. A sore on the skin. How is this treated? You may need to: Put a heat pack or a warm, wet washcloth on the spot. Have the pus drained. Take antibiotics. Follow these instructions at home: Medicines Take over-the-counter and prescription medicines only as told by your doctor. If you were prescribed antibiotics, take them as told by your doctor. Do not stop taking them even if you start to feel better. Abscess care  If you have an abscess that has not drained, put heat on it. Use the heat source that your doctor recommends, such as a moist heat pack or a heating pad. Place a towel between your skin and the heat source. Leave the heat on for 20-30 minutes. If your skin turns bright red, take off the heat right away to prevent burns. The risk of burns is higher if you cannot feel pain, heat, or cold. Follow  instructions from your doctor about how to take care of your abscess. Make sure you: Cover the abscess with a bandage. Wash your hands with soap and water for at least 20 seconds before and after you change your bandage. If you cannot use soap and water, use hand sanitizer. Change your bandage as told by your doctor. Check your abscess every day for signs that the infection is getting worse. Check for: More redness, swelling, or pain. More fluid or blood. Warmth. More pus or a worse smell. General instructions To keep the infection from spreading: Do not share personal items or towels. Do not go in a hot tub with others. Avoid making skin contact with others. Be careful when you get rid of used bandages or any pus from the abscess. Do not smoke or use any products that contain nicotine or tobacco. If you need help quitting, ask your doctor. Contact a doctor if: You see red streaks on your skin near the abscess. You have any signs of worse infection. You vomit every time you eat or drink. You have a fever, chills, or muscle aches. The cyst or abscess comes back. Get help right away if: You have very bad pain. You make less pee (urine) than normal. This information is not intended to replace advice given to you by your health care provider. Make sure you discuss any questions you have with your health care provider. Document Revised: 03/30/2022  Document Reviewed: 03/30/2022 Elsevier Patient Education  2024 [employer].

## 2023-10-29 IMAGING — CT CT ABD-PELV W/ CM
2 of 5 series · 15 of 46 positions shown, 17 images · IV contrast (Omnipaque or Isovue)
Comparison: CT abdomen pelvis 01/08/2021

CLINICAL DATA: Biliary obstruction suspected (Ped 0-17y). Left
testicular pain that started [REDACTED] with abnormal labs. History of
colectomy, partial move of the terminal ileum, and ileocolostomy on
12/21/2020. History of exploratory laparotomy on 12/27/2020

EXAM:
CT ABDOMEN AND PELVIS WITH CONTRAST
TECHNIQUE: Multidetector CT imaging of the abdomen and pelvis was performed
using the standard protocol following bolus administration of
intravenous contrast.

[Series 2: axial st · axial · 0.98mm/px · z∈[+928,+1454]mm · 12 of 119 slices shown, 14 images]
[im 7/119  soft-tissue]
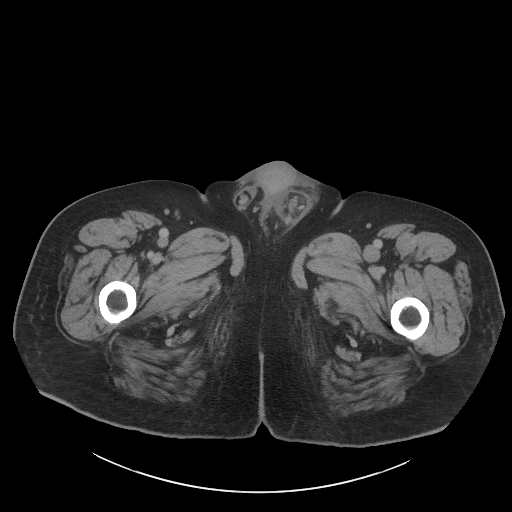
[im 7/119  bone]
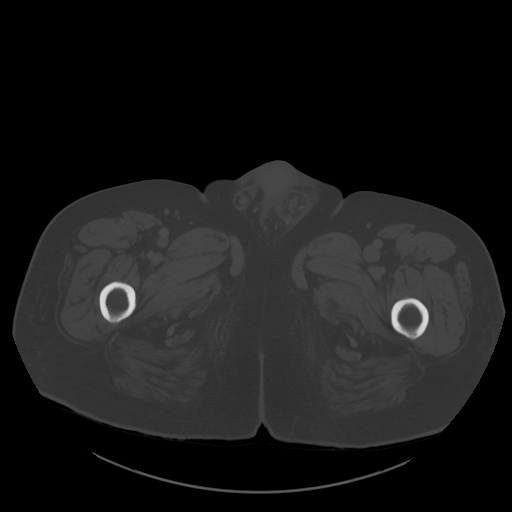
[im 20/119  soft-tissue]
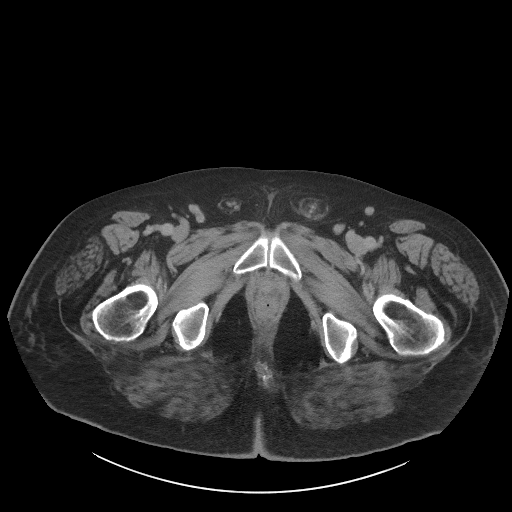
[im 27/119  soft-tissue]
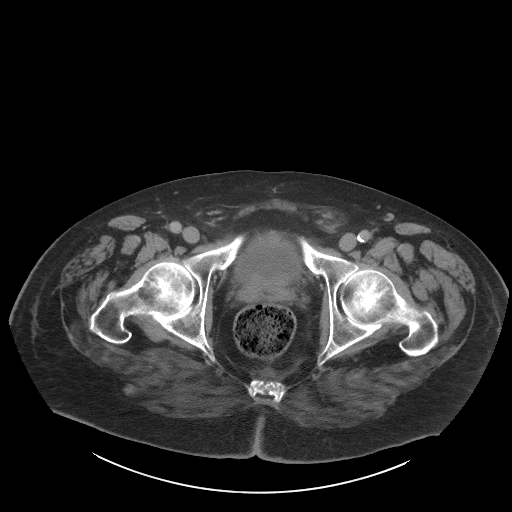
[im 33/119  soft-tissue]
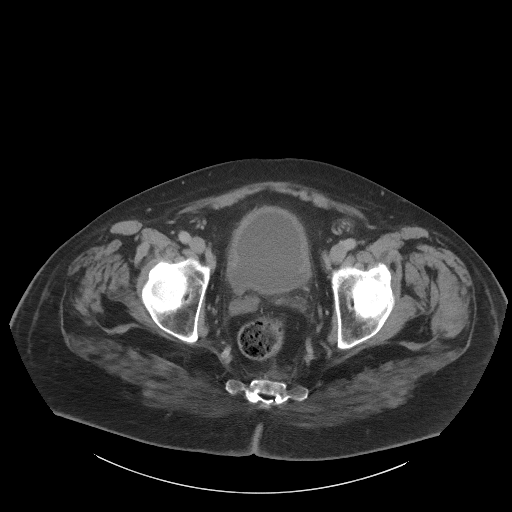
[im 46/119  soft-tissue]
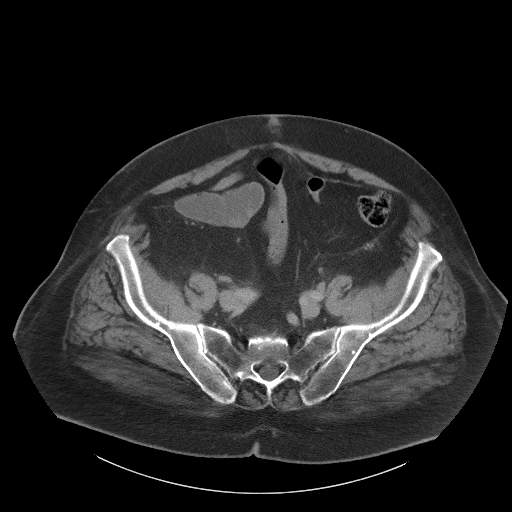
[im 53/119  soft-tissue]
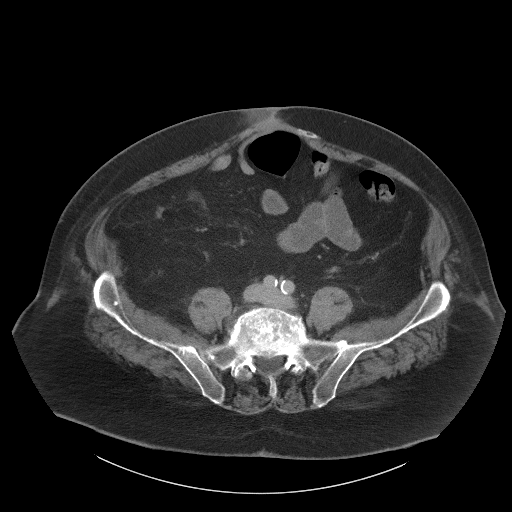
[im 66/119  soft-tissue]
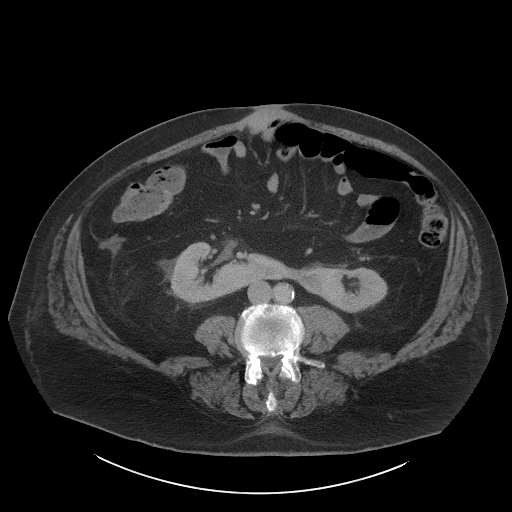
[im 73/119  soft-tissue]
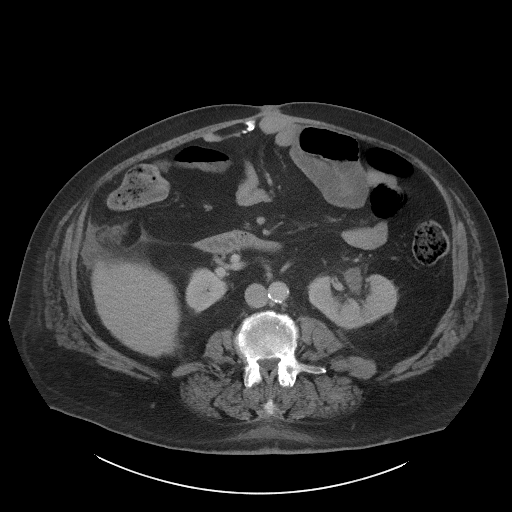
[im 86/119  soft-tissue]
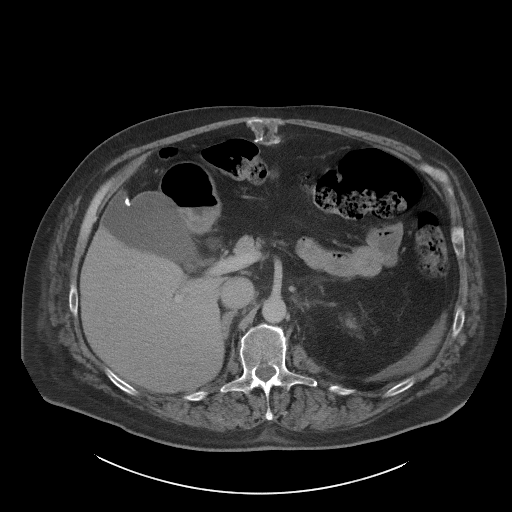
[im 86/119  bone]
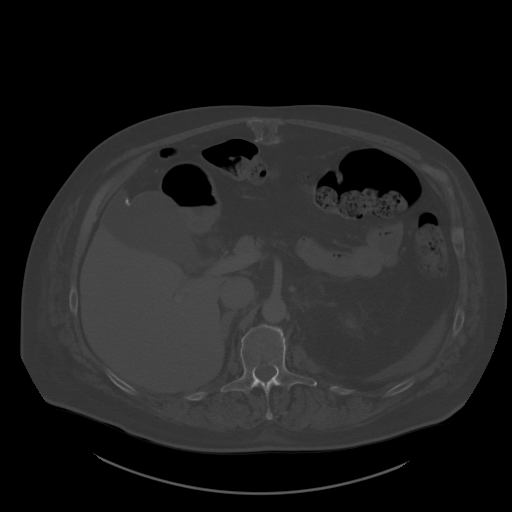
[im 92/119  soft-tissue]
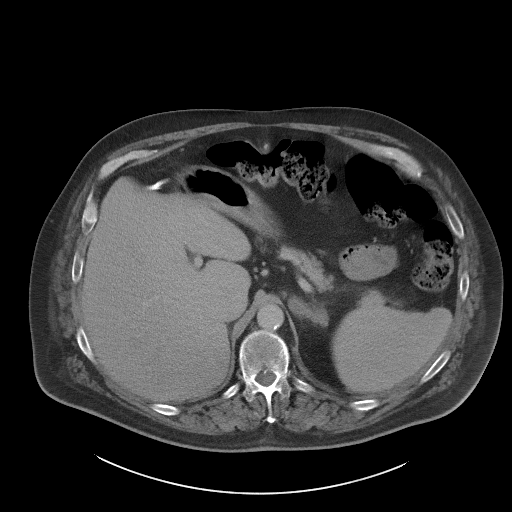
[im 99/119  soft-tissue]
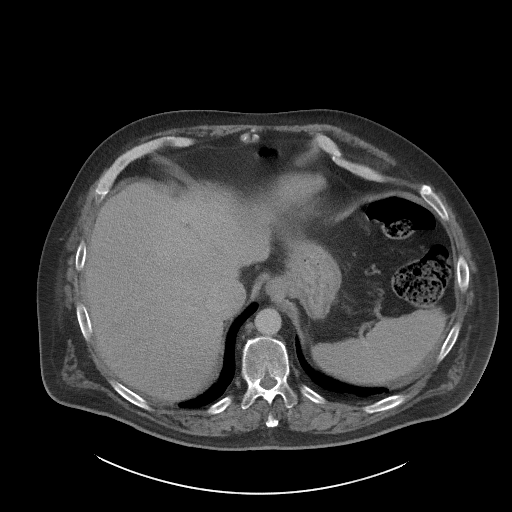
[im 112/119  soft-tissue]
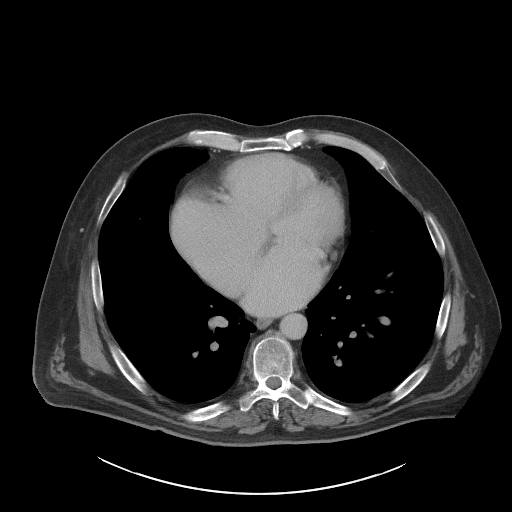

[Series 5: coronal st · coronal · 1.03mm/px · 3 of 126 slices shown]
[im 42/126  soft-tissue]
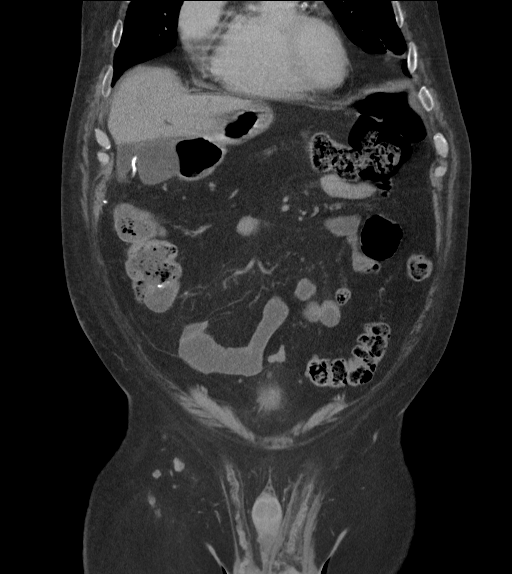
[im 56/126  soft-tissue]
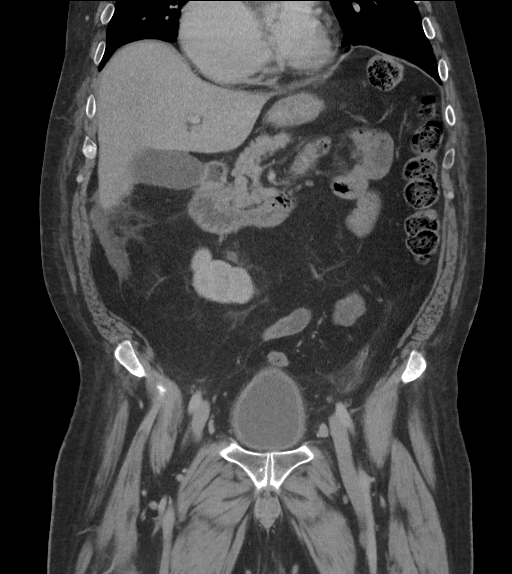
[im 70/126  soft-tissue]
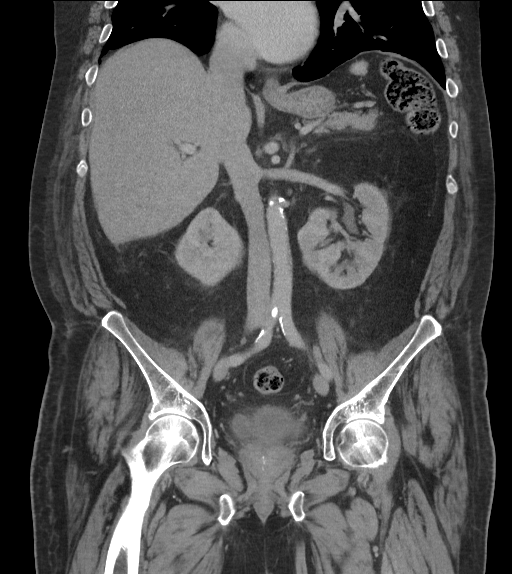

[15 of 46 positions shown; findings below may reference images not displayed]

RADIATION DOSE REDUCTION: This exam was performed according to the
departmental dose-optimization program which includes automated
exposure control, adjustment of the mA and/or kV according to
patient size and/or use of iterative reconstruction technique.

CONTRAST:  100mL OMNIPAQUE IOHEXOL 300 MG/ML  SOLN
FINDINGS: Lower chest: No acute abnormality. Enlarged heart. Coronary artery
calcification.

Hepatobiliary: Perihepatic free fluid and fat stranding along the
right inferior hepatic lobe in the setting of findings suggestive of
surgical material. Subcentimeter hypodensities too small to
characterize. Otherwise no focal liver abnormality. No gallstones,
gallbladder wall thickening, or pericholecystic fluid. No biliary
dilatation.

Pancreas: No focal lesion. Normal pancreatic contour. No surrounding
inflammatory changes. No main pancreatic ductal dilatation.

Spleen: Normal in size without focal abnormality.

Adrenals/Urinary Tract:

No adrenal nodule bilaterally.

A or shoe kidney is noted. Bilateral kidneys enhance symmetrically.
Subcentimeter hypodensities are too small to characterize.

No hydronephrosis. No hydroureter.

Circumferential urinary bladder wall thickening with mild
perivesicular fat stranding.

Stomach/Bowel: Partial colectomy and small bowel resection noted.
Stomach is within normal limits. No evidence of bowel wall
thickening or dilatation.

Vascular/Lymphatic: No abdominal aorta or iliac aneurysm. Moderate
to severe atherosclerotic plaque of the aorta and its branches. No
abdominal, pelvic, or inguinal lymphadenopathy.

Reproductive: Prostate is unremarkable. Trace fluid noted within the
left inguinal canal with query trace bilateral hydroceles. Slight
fat stranding within the left inguinal canal.

Other: Trace right upper quadrant free fluid. No intraperitoneal
free gas. No organized fluid collection. Anterior abdomen
calcification likely postsurgical changes.

Musculoskeletal:

No abdominal wall hernia or abnormality.

No suspicious lytic or blastic osseous lesions. No acute displaced
fracture. Multilevel degenerative changes of the spine.
IMPRESSION: 1. Trace simple free fluid within the right upper abdomen along the
right inferior hepatic lobe and gallbladder. Associated hydropic
gallbladder. No definite gallbladder wall thickening. No findings of
biliary dilatation. Consider correlation with a right upper quadrant
ultrasound for further evaluation.
2. Circumferential urinary bladder wall thickening with mild
perivesicular fat stranding. Correlate with urinalysis for
infection.
3. Left inguinal canal fat stranding and trace free fluid. Please
see separately dictated ultrasound scrotum 10/20/2021 for further
details.
4. Bilateral hydroceles.
5. Incidentally noted horseshoe kidney.
6.  Aortic Atherosclerosis ([id number]).

## 2023-10-29 IMAGING — US US SCROTUM W/ DOPPLER COMPLETE
1 series · 14 of 25 positions shown · non-contrast
Comparison: None.

CLINICAL DATA: Swollen left testicle

EXAM:
SCROTAL ULTRASOUND
DOPPLER ULTRASOUND OF THE TESTICLES
TECHNIQUE: Complete ultrasound examination of the testicles, epididymis, and
other scrotal structures was performed. Color and spectral Doppler
ultrasound were also utilized to evaluate blood flow to the
testicles.

[Series 1: us scrotum w/doppler · 14 of 98 slices shown]
[im 1/98]
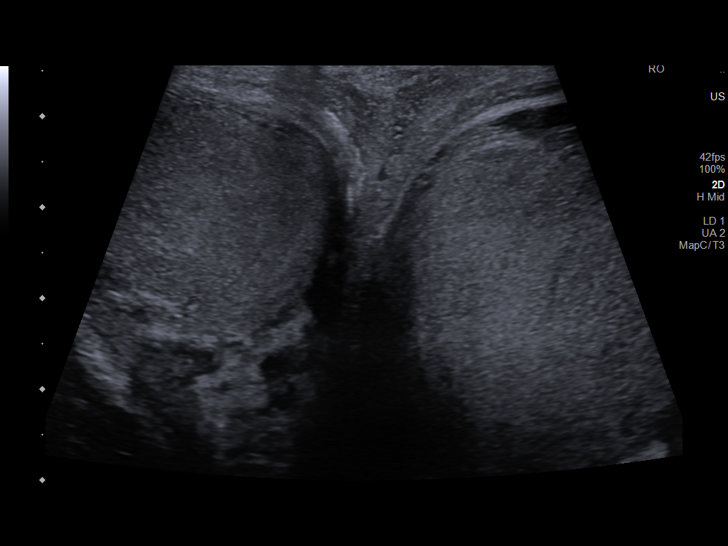
[im 9/98]
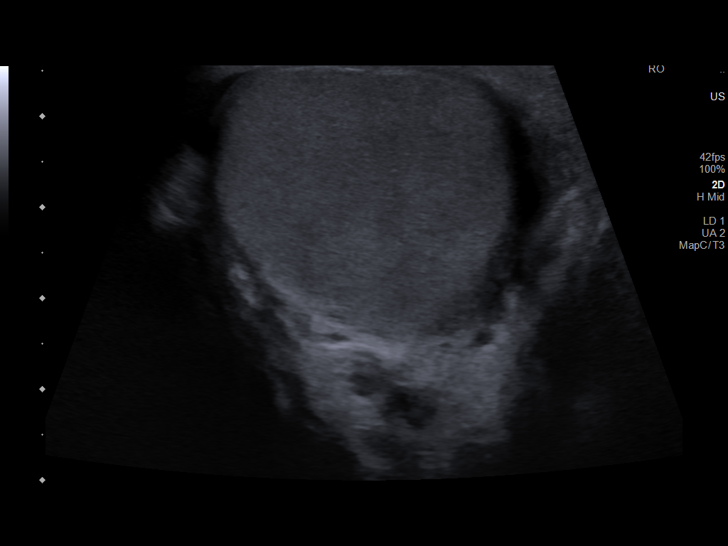
[im 17/98]
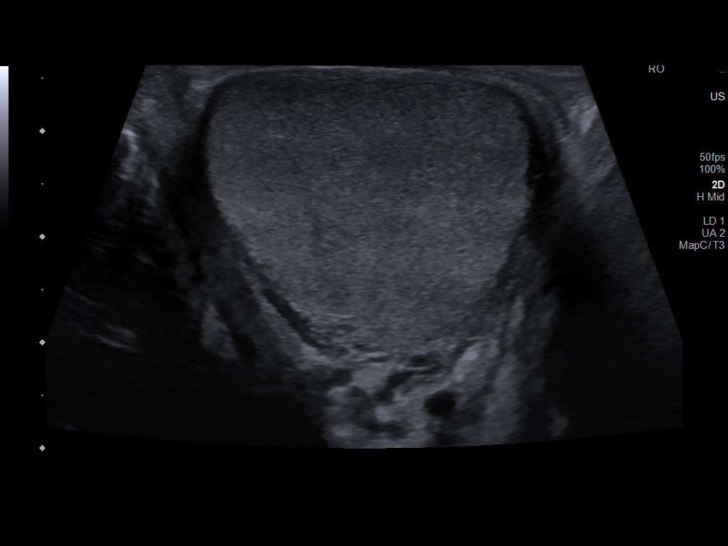
[im 25/98]
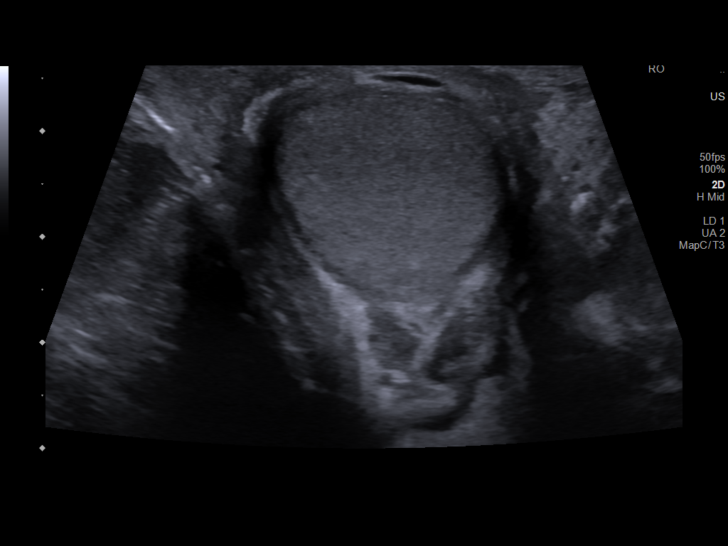
[im 33/98]
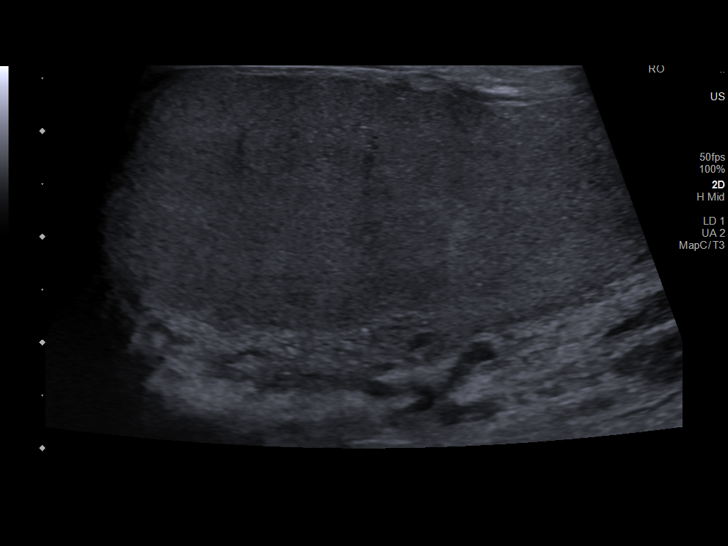
[im 37/98]
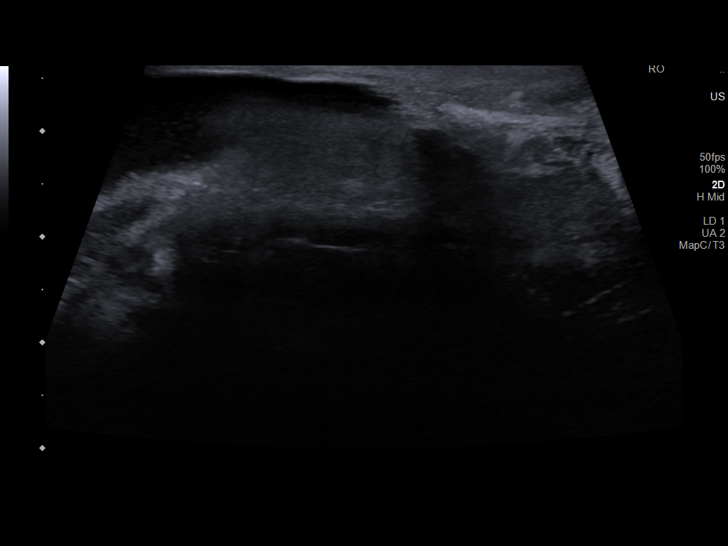
[im 45/98]
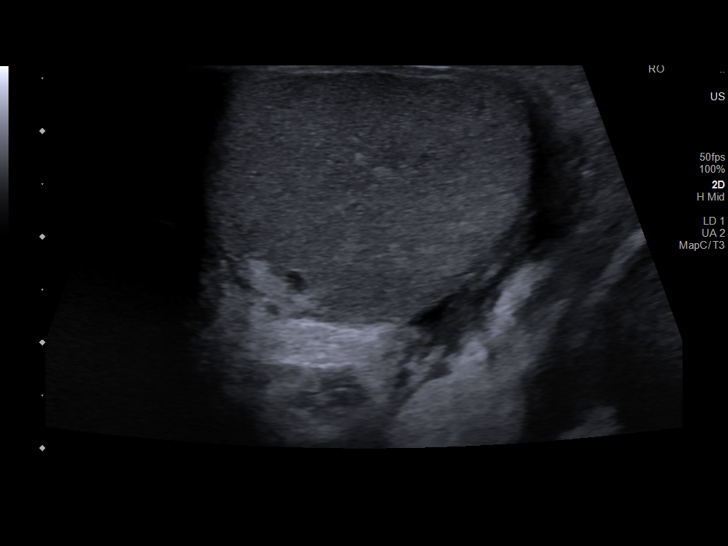
[im 53/98]
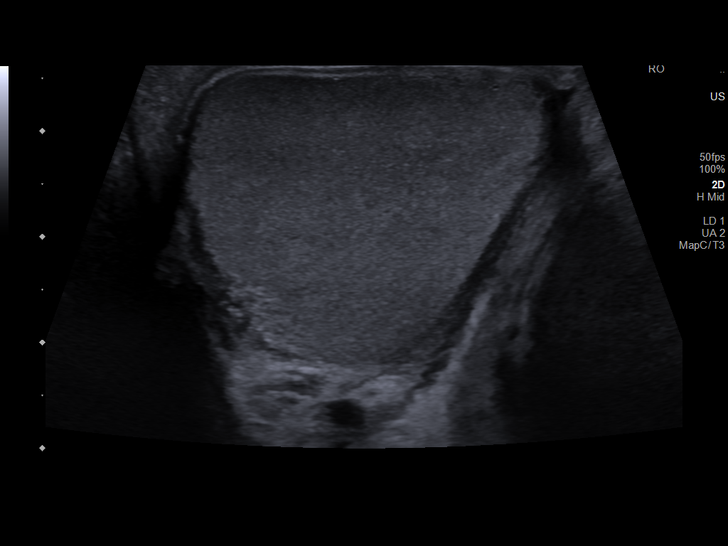
[im 61/98]
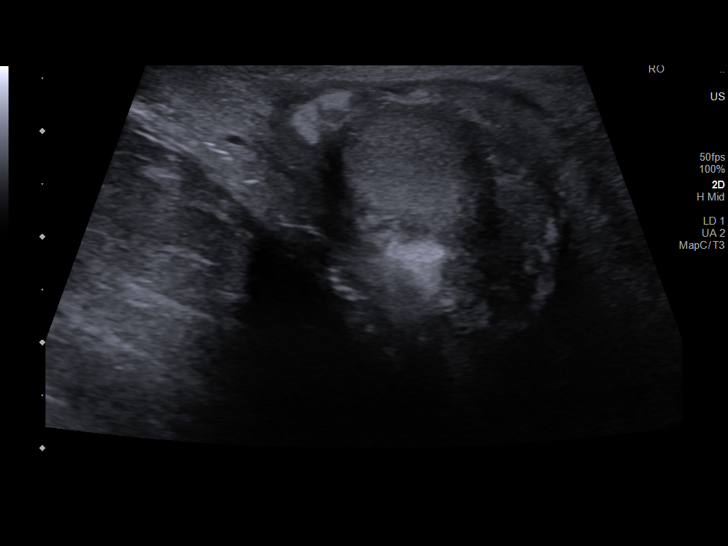
[im 65/98]
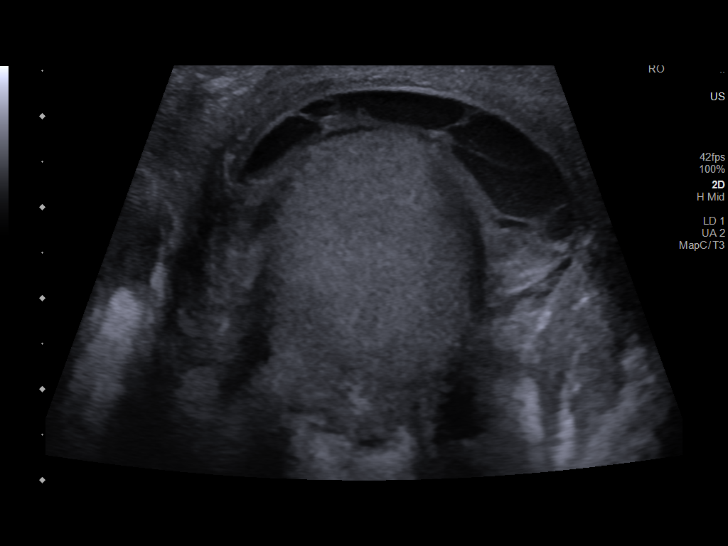
[im 73/98]
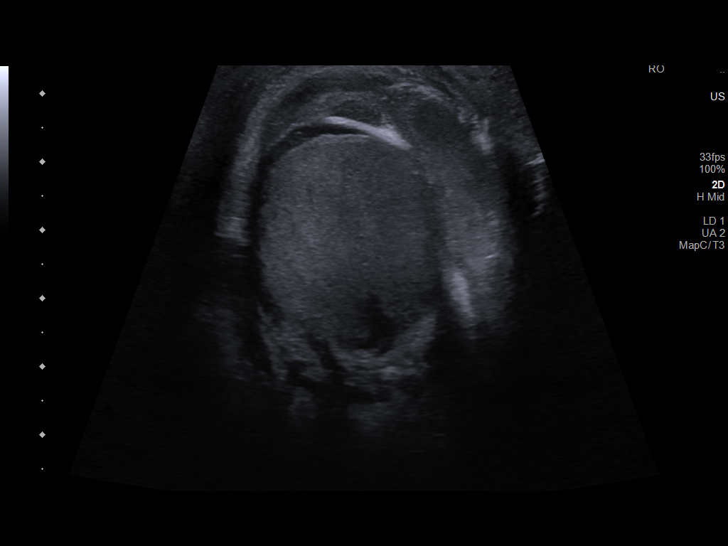
[im 81/98]
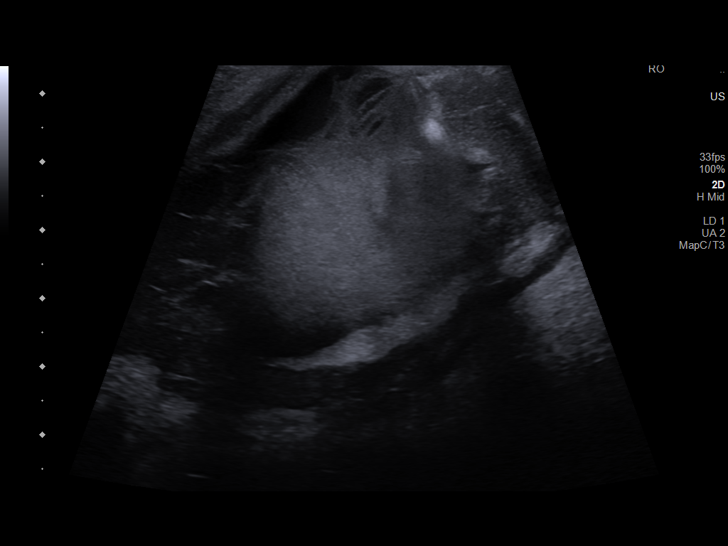
[im 89/98]
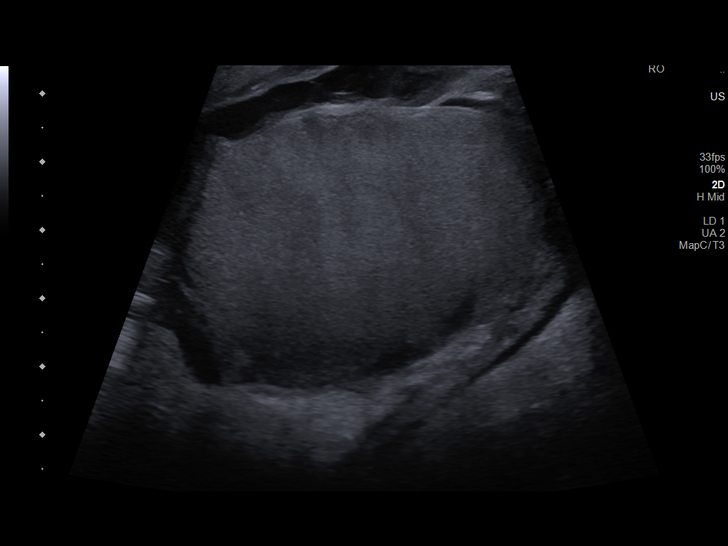
[im 98/98]
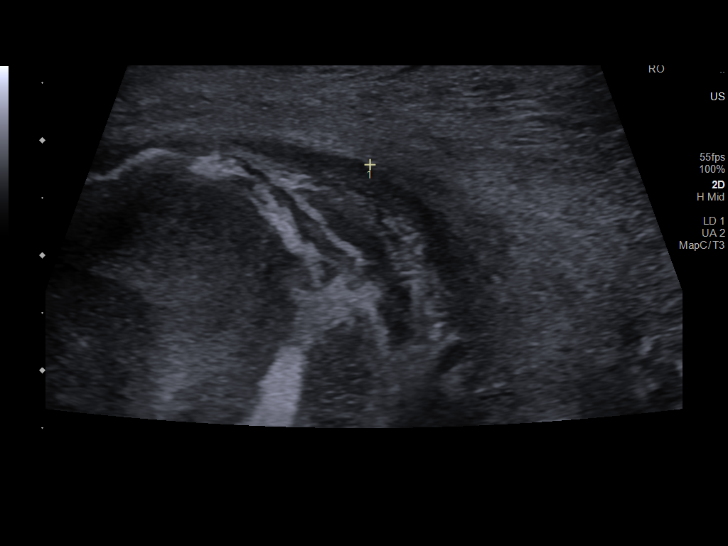

[14 of 25 positions shown; findings below may reference images not displayed]

FINDINGS: Right testicle

Measurements: 5.6 x 2.6 x 3.4 cm. No mass or microlithiasis
visualized.

Left testicle

Measurements: 5.6 x 4.1 x 3.8 cm. No mass or microlithiasis
visualized.

Right epididymis:  Normal in size and appearance.

Left epididymis: Enlargement of left epididymis which is
hypervascular. Mild complex fluid surrounding the epididymis.

Hydrocele:  Complex left hydrocele.  Minimal right hydrocele.

Varicocele:  None visualized.

Pulsed Doppler interrogation of both testes demonstrates normal low
resistance arterial and venous waveforms bilaterally.
IMPRESSION: Normal testes

Enlargement of the left but did epididymis with hypervascularity and
complex surrounding fluid. Findings most compatible with left
epididymitis.

## 2024-01-17 DIAGNOSIS — Z299 Encounter for prophylactic measures, unspecified: Secondary | ICD-10-CM | POA: Diagnosis not present

## 2024-01-17 DIAGNOSIS — E1169 Type 2 diabetes mellitus with other specified complication: Secondary | ICD-10-CM | POA: Diagnosis not present

## 2024-01-17 DIAGNOSIS — E1165 Type 2 diabetes mellitus with hyperglycemia: Secondary | ICD-10-CM | POA: Diagnosis not present

## 2024-01-17 DIAGNOSIS — E7800 Pure hypercholesterolemia, unspecified: Secondary | ICD-10-CM | POA: Diagnosis not present

## 2024-01-17 DIAGNOSIS — I4891 Unspecified atrial fibrillation: Secondary | ICD-10-CM | POA: Diagnosis not present

## 2024-01-24 ENCOUNTER — Ambulatory Visit (INDEPENDENT_AMBULATORY_CARE_PROVIDER_SITE_OTHER): Payer: Medicare Other | Admitting: Urology

## 2024-01-24 ENCOUNTER — Encounter: Payer: Self-pay | Admitting: Urology

## 2024-01-24 VITALS — BP 129/82 | HR 106

## 2024-01-24 DIAGNOSIS — N454 Abscess of epididymis or testis: Secondary | ICD-10-CM

## 2024-01-24 DIAGNOSIS — S3130XA Unspecified open wound of scrotum and testes, initial encounter: Secondary | ICD-10-CM | POA: Diagnosis not present

## 2024-01-24 DIAGNOSIS — S31109A Unspecified open wound of abdominal wall, unspecified quadrant without penetration into peritoneal cavity, initial encounter: Secondary | ICD-10-CM

## 2024-01-24 DIAGNOSIS — N401 Enlarged prostate with lower urinary tract symptoms: Secondary | ICD-10-CM

## 2024-01-24 LAB — URINALYSIS, ROUTINE W REFLEX MICROSCOPIC
Bilirubin, UA: NEGATIVE
Glucose, UA: NEGATIVE
Ketones, UA: NEGATIVE
Leukocytes,UA: NEGATIVE
Nitrite, UA: NEGATIVE
Protein,UA: NEGATIVE
RBC, UA: NEGATIVE
Specific Gravity, UA: 1.02 (ref 1.005–1.030)
Urobilinogen, Ur: 0.2 mg/dL (ref 0.2–1.0)
pH, UA: 6 (ref 5.0–7.5)

## 2024-01-24 MED ORDER — CLOTRIMAZOLE-BETAMETHASONE 1-0.05 % EX CREA: 1.0000 | TOPICAL_CREAM | Freq: Two times a day (BID) | CUTANEOUS | 3 refills | Status: DC

## 2024-01-24 NOTE — Patient Instructions (Signed)
 Orchitis  Orchitis is inflammation of a testicle. Testicles are the male organs that produce sperm. The testicles are held in a fleshy sac (scrotum) located behind the penis. Orchitis usually affects only one testicle, but it can affect both. Orchitis is caused by infection. Many kinds of bacteria and viruses can cause this infection. The condition can develop suddenly. What are the causes? This condition may be caused by: Infection from viruses or bacteria. Other organisms, such as fungi or parasites. This is rare but can happen in men who have a weak body defense system (immune system), such as men who have HIV. Bacteria  Bacterial orchitis often occurs along with an infection of the tube that collects and stores sperm (epididymis). In men who are not sexually active, this infection usually starts as a urinary tract infection and spreads to the testicle. In sexually active men, sexually transmitted infections (STIs) are the most common cause of bacterial orchitis. These can include: Gonorrhea. Chlamydia. Viruses Mumps is the most common cause of viral orchitis, though mumps is now rare in many areas because of vaccination. Other viruses that can cause orchitis include: The chickenpox virus (varicella-zoster virus). The virus that causes mononucleosis (Epstein-Barr virus). What increases the risk? The following factors may make you more likely to develop this condition: For viral orchitis: Not having been vaccinated against mumps. For bacterial orchitis: Having had frequent urinary tract infections. Engaging in high-risk sexual behaviors, such as having multiple sexual partners or having sex without using a condom. Having a sexual partner with an STI. Having had urinary tract surgery. Using a tube that is passed through the penis to drain urine (Foley catheter). Having an enlarged prostate gland. What are the signs or symptoms? The most common symptoms of orchitis are swelling and  pain in the scrotum. Other signs and symptoms may include: Feeling generally sick (malaise). Fever and chills. Painful urination. Painful ejaculation. Headache. Fatigue. Nausea. Blood or discharge from the penis. Swollen lymph nodes in the groin area (inguinal nodes). How is this diagnosed? This condition may be diagnosed based on: Your symptoms. Your health care provider may suspect orchitis if you have a painful, swollen testicle along with other signs and symptoms of the condition. A physical exam. You may also have other tests, including: A blood test to check for signs of infection. A urine test to check for a urinary tract infection or STI. Using a swab to collect a fluid sample from the tip of the penis to test for STIs. Taking an image of the testicle using sound waves and a computer (testicular ultrasound). How is this treated? Treatment for this condition depends on the cause.  For bacterial orchitis, your health care provider may prescribe antibiotic medicines. Bacterial infections usually clear up within a few days. For both viral infections and bacterial infections, treatment may include: Rest. Anti-inflammatory medicines. Pain medicines. Raising (elevating) the scrotum with a towel or pillow and applying ice. Follow these instructions at home: Managing pain and swelling Elevate your scrotum and apply ice as directed. To do this: Put ice in a plastic bag. Place a small towel or pillow between your legs. Rest your scrotum on the pillow or towel. Place another towel between your skin and the plastic bag. Leave the ice on for 20 minutes, 2-3 times a day. Remove the ice if your skin turns bright red. This is very important. If you cannot feel pain, heat, or cold, you have a greater risk of damage to the area. General instructions  Rest as told by your health care provider. Take over-the-counter and prescription medicines only as told by your health care provider. If you  were prescribed an antibiotic medicine, take it as told by your health care provider. Do not stop taking the antibiotic even if you start to feel better. Do not have sex until your health care provider says it is okay to do so. Keep all follow-up visits. This is important. Contact a health care provider if: You have a fever. Pain and swelling have not gotten better after 3 days. Get help right away if: Your pain is getting worse. The swelling in your testicle gets worse. Summary Orchitis is inflammation of a testicle. It is caused by an infection from bacteria or a virus. The most common symptoms of orchitis are swelling and pain in the scrotum. Treatment for this condition depends on the cause. It may include medicines to fight the infection, reduce inflammation, and relieve the pain. Follow your health care provider's instructions about resting, icing, not having sex, and taking medicines. This information is not intended to replace advice given to you by your health care provider. Make sure you discuss any questions you have with your health care provider. Document Revised: 02/23/2021 Document Reviewed: 02/23/2021 Elsevier Patient Education  2024 [employer].

## 2024-01-24 NOTE — Progress Notes (Signed)
 01/24/2024 11:39 AM   Doris Garnet 08/21/1956 [id number]  Referring provider: Orlena Bitters, MD [address] [place],  [place] [address]  Followup scrotal wound   HPI: Mr Cass is a 68yo here for followup fro a left scrotal wound. He notes intermittent drainage from his scrotal wound. The drainage improved once he started ozempic. He is applying clotrimazole  prn to the wound. He denies any swelling or pain. His A1c is 6.3.    PMH: Past Medical History:  Diagnosis Date   Atrial fibrillation (HCC)    Diabetes mellitus without complication (HCC)    Dysrhythmia    History of sinus surgery     Surgical History: Past Surgical History:  Procedure Laterality Date   COLON RESECTION  12/21/2020   removal of right colon   INCISION AND DRAINAGE ABSCESS N/A 06/17/2022   Procedure: INCISION AND DRAINAGE ABSCESS;  Surgeon: Marco Severs, MD;  Location: [facility];  Service: Urology;  Laterality: N/A;   ORCHIECTOMY Left 11/19/2021   Procedure: ORCHIECTOMY;  Surgeon: Marco Severs, MD;  Location: [facility];  Service: Urology;  Laterality: Left;   SCROTAL EXPLORATION Left 11/19/2021   Procedure: SCROTUM EXPLORATION- Incision and debridement;  Surgeon: Marco Severs, MD;  Location: [facility];  Service: Urology;  Laterality: Left;   SCROTAL EXPLORATION N/A 06/17/2022   Procedure: SCROTUM EXPLORATION;  Surgeon: Marco Severs, MD;  Location: [facility];  Service: Urology;  Laterality: N/A;    Home Medications:  Allergies as of 01/24/2024       Reactions   Tamsulosin Other (See Comments)   Abdominal bloating, nausea, fatigue, dysnea        Medication List        Accurate as of Jan 24, 2024 11:39 AM. If you have any questions, ask your nurse or doctor.          STOP taking these medications    amoxicillin -clavulanate 875-125 MG tablet Commonly known as: AUGMENTIN    diltiazem  300 MG 24 hr capsule Commonly known as: CARDIZEM  CD   glipiZIDE 5 MG tablet Commonly known as:  GLUCOTROL   oxyCODONE -acetaminophen  7.5-325 MG tablet Commonly known as: Percocet       TAKE these medications    aspirin 325 MG tablet Take 325 mg by mouth daily.   bacitracin  500 UNIT/GM ointment Apply 1 Application topically 2 (two) times daily.   clotrimazole -betamethasone  cream Commonly known as: Lotrisone  Apply 1 Application topically 2 (two) times daily.   fish oil-omega-3 fatty acids 1000 MG capsule Take 1 capsule by mouth daily.   One-A-Day Weight Smart Advance Tabs Take 1 tablet by mouth daily.        Allergies:  Allergies  Allergen Reactions   Tamsulosin Other (See Comments)    Abdominal bloating, nausea, fatigue, dysnea    Family History: Family History  Problem Relation Age of Onset   Heart disease Other    Heart failure Other     Social History:  reports that he has quit smoking. His smoking use included cigarettes. He quit smokeless tobacco use about 32 years ago.  His smokeless tobacco use included chew. He reports that he does not drink alcohol and does not use drugs.  ROS: All other review of systems were reviewed and are negative except what is noted above in HPI  Physical Exam: BP 129/82   Pulse (!) 106   Constitutional:  Alert and oriented, No acute distress. HEENT: [place] AT, moist mucus membranes.  Trachea midline, no masses.  Cardiovascular: No clubbing, cyanosis, or edema. Respiratory: Normal respiratory effort, no increased work of breathing. GI: Abdomen is soft, nontender, nondistended, no abdominal masses GU: No CVA tenderness.  Lymph: No cervical or inguinal lymphadenopathy. Skin: No rashes, bruises or suspicious lesions. Neurologic: Grossly intact, no focal deficits, moving all 4 extremities. Psychiatric: Normal mood and affect.  Laboratory Data: Lab Results  Component Value Date   WBC 20.2 (H) 10/20/2021   HGB 13.7 10/20/2021   HCT 42.2 10/20/2021   MCV 92.1 10/20/2021   PLT 195 10/20/2021    Lab Results  Component  Value Date   CREATININE 0.95 06/16/2022    No results found for: "PSA"  No results found for: "TESTOSTERONE"  Lab Results  Component Value Date   HGBA1C 6.4 (H) 06/16/2022    Urinalysis    Component Value Date/Time   COLORURINE YELLOW 10/20/2021 1616   APPEARANCEUR Clear 09/25/2023 1145   LABSPEC 1.023 10/20/2021 1616   PHURINE 5.0 10/20/2021 1616   GLUCOSEU Negative 09/25/2023 1145   HGBUR SMALL (A) 10/20/2021 1616   BILIRUBINUR Negative 09/25/2023 1145   KETONESUR NEGATIVE 10/20/2021 1616   PROTEINUR Negative 09/25/2023 1145   PROTEINUR 100 (A) 10/20/2021 1616   NITRITE Negative 09/25/2023 1145   NITRITE NEGATIVE 10/20/2021 1616   LEUKOCYTESUR Negative 09/25/2023 1145   LEUKOCYTESUR LARGE (A) 10/20/2021 1616    Lab Results  Component Value Date   LABMICR Comment 09/25/2023   BACTERIA RARE (A) 10/20/2021    Pertinent Imaging:  No results found for this or any previous visit.  No results found for this or any previous visit.  No results found for this or any previous visit.  No results found for this or any previous visit.  No results found for this or any previous visit.  No results found for this or any previous visit.  No results found for this or any previous visit.  No results found for this or any previous visit.   Assessment & Plan:    1.  Left scrotal wound -We discussed referral to general surgery and the patient wishes to proceed with the referral. Continue clotrimazole  prn   No follow-ups on file.  Johnie Nailer, MD  [facility] [place]

## 2024-02-06 ENCOUNTER — Ambulatory Visit: Admitting: General Surgery

## 2024-02-06 ENCOUNTER — Encounter: Payer: Self-pay | Admitting: General Surgery

## 2024-02-06 VITALS — BP 147/93 | HR 100 | Temp 97.6°F | Resp 14 | Ht 78.0 in | Wt 336.0 lb

## 2024-02-06 DIAGNOSIS — L929 Granulomatous disorder of the skin and subcutaneous tissue, unspecified: Secondary | ICD-10-CM

## 2024-02-06 NOTE — Progress Notes (Signed)
 Ryan Lowe; [id number]; March 07, 1956   HPI Patient is a 68 year old white male who was referred to my care by Dr. Claretta Croft of urology for evaluation and treatment of a granuloma in the left groin crease by the scrotum.  Patient has had surgery there in the past but developed a granuloma which is been draining serosanguineous fluid on and off for many months.  Patient denies any fevers.  Patient states the drainage has been decreased over the past few weeks, but is still present on occasion. Past Medical History:  Diagnosis Date   Atrial fibrillation (HCC)    Diabetes mellitus without complication (HCC)    Dysrhythmia    History of sinus surgery     Past Surgical History:  Procedure Laterality Date   COLON RESECTION  12/21/2020   removal of right colon   INCISION AND DRAINAGE ABSCESS N/A 06/17/2022   Procedure: INCISION AND DRAINAGE ABSCESS;  Surgeon: Marco Severs, MD;  Location: [facility];  Service: Urology;  Laterality: N/A;   ORCHIECTOMY Left 11/19/2021   Procedure: ORCHIECTOMY;  Surgeon: Marco Severs, MD;  Location: [facility];  Service: Urology;  Laterality: Left;   SCROTAL EXPLORATION Left 11/19/2021   Procedure: SCROTUM EXPLORATION- Incision and debridement;  Surgeon: Marco Severs, MD;  Location: [facility];  Service: Urology;  Laterality: Left;   SCROTAL EXPLORATION N/A 06/17/2022   Procedure: SCROTUM EXPLORATION;  Surgeon: Marco Severs, MD;  Location: [facility];  Service: Urology;  Laterality: N/A;    Family History  Problem Relation Age of Onset   Heart disease Other    Heart failure Other     Current Outpatient Medications on File Prior to Visit  Medication Sig Dispense Refill   Multiple Vitamins-Minerals (ONE-A-DAY WEIGHT SMART ADVANCE) TABS Take 1 tablet by mouth daily.       OZEMPIC, 0.25 OR 0.5 MG/DOSE, 2 MG/3ML SOPN SMARTSIG:0.75 Milliliter(s) SUB-Q Once a Week     No current facility-administered medications on file prior to visit.    Allergies   Allergen Reactions   Tamsulosin Other (See Comments)    Abdominal bloating, nausea, fatigue, dysnea    Social History   Substance and Sexual Activity  Alcohol Use No    Social History   Tobacco Use  Smoking Status Former   Types: Cigarettes  Smokeless Tobacco Former   Types: Chew   Quit date: 08/30/1991  Tobacco Comments   Occasional cig mostly dipped.    Review of Systems  Constitutional: Negative.   HENT: Negative.    Eyes: Negative.   Respiratory: Negative.    Cardiovascular: Negative.   Gastrointestinal: Negative.   Genitourinary: Negative.   Musculoskeletal: Negative.   Skin: Negative.   Neurological: Negative.   Endo/Heme/Allergies: Negative.   Psychiatric/Behavioral: Negative.      Objective   Vitals:   02/06/24 1424  BP: (!) 147/93  Pulse: 100  Resp: 14  Temp: 97.6 F (36.4 C)  SpO2: 94%    Physical Exam Vitals reviewed.  Constitutional:      Appearance: Normal appearance. He is obese. He is not ill-appearing.  HENT:     Head: Normocephalic and atraumatic.  Cardiovascular:     Rate and Rhythm: Normal rate and regular rhythm.     Heart sounds: Normal heart sounds. No murmur heard.    No friction rub. No gallop.  Pulmonary:     Effort: Pulmonary effort is normal. No respiratory distress.     Breath sounds: Normal breath sounds. No stridor. No wheezing,  rhonchi or rales.  Skin:    General: Skin is warm and dry.     Comments: A small punctate opening is present in the left groin along the scrotum.  Scarring from previous surgery present.  I did probe this with a silver nitrate stick and minimal tunneling was noted.  No purulent drainage was noted.  Neurological:     Mental Status: He is alert and oriented to person, place, and time.     Assessment  Granula, left groin wound Plan  I told the patient to keep the wound clean and dry with soap and water.  He should see me back in 2 weeks for reevaluation of the wound.  Hopefully we can treat  it locally without wound exploration.

## 2024-02-13 DIAGNOSIS — E7800 Pure hypercholesterolemia, unspecified: Secondary | ICD-10-CM | POA: Diagnosis not present

## 2024-02-13 DIAGNOSIS — Z7189 Other specified counseling: Secondary | ICD-10-CM | POA: Diagnosis not present

## 2024-02-13 DIAGNOSIS — R5383 Other fatigue: Secondary | ICD-10-CM | POA: Diagnosis not present

## 2024-02-13 DIAGNOSIS — Z79899 Other long term (current) drug therapy: Secondary | ICD-10-CM | POA: Diagnosis not present

## 2024-02-13 DIAGNOSIS — Z299 Encounter for prophylactic measures, unspecified: Secondary | ICD-10-CM | POA: Diagnosis not present

## 2024-02-13 DIAGNOSIS — Z0000 Encounter for general adult medical examination without abnormal findings: Secondary | ICD-10-CM | POA: Diagnosis not present

## 2024-02-13 DIAGNOSIS — Z713 Dietary counseling and surveillance: Secondary | ICD-10-CM | POA: Diagnosis not present

## 2024-02-13 DIAGNOSIS — Z1389 Encounter for screening for other disorder: Secondary | ICD-10-CM | POA: Diagnosis not present

## 2024-02-19 DIAGNOSIS — E119 Type 2 diabetes mellitus without complications: Secondary | ICD-10-CM | POA: Diagnosis not present

## 2024-02-22 ENCOUNTER — Encounter: Payer: Self-pay | Admitting: General Surgery

## 2024-02-22 ENCOUNTER — Ambulatory Visit: Admitting: General Surgery

## 2024-02-22 VITALS — BP 113/82 | HR 103 | Temp 98.3°F | Resp 16 | Ht 78.0 in | Wt 324.0 lb

## 2024-02-22 DIAGNOSIS — L929 Granulomatous disorder of the skin and subcutaneous tissue, unspecified: Secondary | ICD-10-CM | POA: Diagnosis not present

## 2024-02-22 NOTE — Progress Notes (Signed)
 Subjective:     Ryan Lowe  Patient here for wound check.  He states that recently has had minimal serous drainage from the wound.  It is scant in nature over the past few days. Objective:    BP 113/82   Pulse (!) 103   Temp 98.3 F (36.8 C) (Oral)   Resp 16   Ht 6' 6 (1.981 m)   Wt (!) 324 lb (147 kg)   SpO2 95%   BMI 37.44 kg/m   General:  alert, cooperative, and no distress  The granulomatous wound in the left groin and scrotal region has healed over with minimal superficial granulation tissue present.  No induration is noted.  I cannot express any fluid from the area.  Silver nitrate was applied to the granulomatous skin.     Assessment:    Granuloma of wound, scrotum.  This appears to be healed over.    Plan:   I told him to return my care should the wound open or start draining.  Keep wound clean and dry.  May apply antibiotic cream as needed.  Follow-up here as needed.

## 2024-04-26 ENCOUNTER — Ambulatory Visit: Admitting: Urology

## 2024-05-14 ENCOUNTER — Encounter: Payer: Self-pay | Admitting: General Surgery

## 2024-05-14 ENCOUNTER — Ambulatory Visit: Admitting: General Surgery

## 2024-05-14 VITALS — BP 147/85 | HR 86 | Temp 97.5°F | Resp 16 | Ht 78.0 in | Wt 326.0 lb

## 2024-05-14 DIAGNOSIS — L929 Granulomatous disorder of the skin and subcutaneous tissue, unspecified: Secondary | ICD-10-CM

## 2024-05-14 NOTE — Progress Notes (Signed)
 Subjective:     Ryan Lowe  Patient presents back for evaluation of his granuloma in the left groin.  He states he had some serous drainage recently, but that has slowed up.  He denies any fevers. Objective:    BP (!) 147/85   Pulse 86   Temp (!) 97.5 F (36.4 C) (Oral)   Resp 16   Ht 6' 6 (1.981 m)   Wt (!) 326 lb (147.9 kg)   SpO2 93%   BMI 37.67 kg/m   General:  alert, cooperative, and no distress  There is a superficial granulomatous wound that is healed over in the left groin region at the level of the scrotum.  I could not express any fluid from the area.  Silver nitrate was applied to the granuloma skin which was minimal.     Assessment:    Granuloma of wound, scrotum/left groin.  No significant worsening since I last saw him.    Plan:   I told him to keep the wound clean and dry daily.  He should clipped the hair around this region.  Follow-up here as needed.

## 2024-06-14 DIAGNOSIS — I10 Essential (primary) hypertension: Secondary | ICD-10-CM | POA: Diagnosis not present

## 2024-06-14 DIAGNOSIS — R5383 Other fatigue: Secondary | ICD-10-CM | POA: Diagnosis not present

## 2024-06-14 DIAGNOSIS — Z299 Encounter for prophylactic measures, unspecified: Secondary | ICD-10-CM | POA: Diagnosis not present

## 2024-06-14 DIAGNOSIS — I4891 Unspecified atrial fibrillation: Secondary | ICD-10-CM | POA: Diagnosis not present

## 2024-06-14 DIAGNOSIS — Z0000 Encounter for general adult medical examination without abnormal findings: Secondary | ICD-10-CM | POA: Diagnosis not present

## 2024-06-14 DIAGNOSIS — E119 Type 2 diabetes mellitus without complications: Secondary | ICD-10-CM | POA: Diagnosis not present

## 2024-06-18 DIAGNOSIS — R5383 Other fatigue: Secondary | ICD-10-CM | POA: Diagnosis not present

## 2024-06-18 DIAGNOSIS — E7800 Pure hypercholesterolemia, unspecified: Secondary | ICD-10-CM | POA: Diagnosis not present

## 2024-07-31 NOTE — Progress Notes (Signed)
 HARACE MCCLUNEY                                          MRN: [id number]   07/31/2024   The [facility] Quality Team Specialist reviewed this patient medical record for the purposes of chart review for care gap closure. The following were reviewed: chart review for care gap closure-kidney health evaluation for diabetes:eGFR  and uACR.    [facility] Quality Team

## 2024-09-06 ENCOUNTER — Ambulatory Visit: Admitting: Urology

## 2024-09-06 VITALS — BP 122/78 | HR 92

## 2024-09-06 DIAGNOSIS — Z87438 Personal history of other diseases of male genital organs: Secondary | ICD-10-CM

## 2024-09-06 DIAGNOSIS — S31109A Unspecified open wound of abdominal wall, unspecified quadrant without penetration into peritoneal cavity, initial encounter: Secondary | ICD-10-CM

## 2024-09-06 DIAGNOSIS — Z09 Encounter for follow-up examination after completed treatment for conditions other than malignant neoplasm: Secondary | ICD-10-CM | POA: Diagnosis not present

## 2024-09-06 DIAGNOSIS — N454 Abscess of epididymis or testis: Secondary | ICD-10-CM

## 2024-09-06 DIAGNOSIS — N1339 Other hydronephrosis: Secondary | ICD-10-CM

## 2024-09-06 NOTE — Progress Notes (Unsigned)
 "  09/06/2024 12:28 PM   Ryan Lowe 1955/10/19 [id number]  Referring provider: Sherrilee Ryan CROME, MD [address] [place],  [place] [address]  No chief complaint on file.   HPI: Mr Ryan Lowe is a 69yo here for followup for a scrotal wound. He saw Dr. Mavis three times and had his wound treated. He denies any drainage from the incision.    PMH: Past Medical History:  Diagnosis Date   Atrial fibrillation (HCC)    Diabetes mellitus without complication (HCC)    Dysrhythmia    History of sinus surgery     Surgical History: Past Surgical History:  Procedure Laterality Date   COLON RESECTION  12/21/2020   removal of right colon   INCISION AND DRAINAGE ABSCESS N/A 06/17/2022   Procedure: INCISION AND DRAINAGE ABSCESS;  Surgeon: Sherrilee Ryan CROME, MD;  Location: [facility];  Service: Urology;  Laterality: N/A;   ORCHIECTOMY Left 11/19/2021   Procedure: ORCHIECTOMY;  Surgeon: Sherrilee Ryan CROME, MD;  Location: [facility];  Service: Urology;  Laterality: Left;   SCROTAL EXPLORATION Left 11/19/2021   Procedure: SCROTUM EXPLORATION- Incision and debridement;  Surgeon: Sherrilee Ryan CROME, MD;  Location: [facility];  Service: Urology;  Laterality: Left;   SCROTAL EXPLORATION N/A 06/17/2022   Procedure: SCROTUM EXPLORATION;  Surgeon: Sherrilee Ryan CROME, MD;  Location: [facility];  Service: Urology;  Laterality: N/A;    Home Medications:  Allergies as of 09/06/2024       Reactions   Tamsulosin Other (See Comments)   Abdominal bloating, nausea, fatigue, dysnea        Medication List        Accurate as of September 06, 2024 12:28 PM. If you have any questions, ask your nurse or doctor.          levocetirizine 5 MG tablet Commonly known as: XYZAL Take 5 mg by mouth daily.   One-A-Day Weight Smart Advance Tabs Take 1 tablet by mouth daily.   Ozempic (0.25 or 0.5 MG/DOSE) 2 MG/3ML Sopn Generic drug: Semaglutide(0.25 or 0.5MG /DOS) SMARTSIG:0.75 Milliliter(s) SUB-Q Once a  Week        Allergies: Allergies[1]  Family History: Family History  Problem Relation Age of Onset   Heart disease Other    Heart failure Other     Social History:  reports that he has quit smoking. His smoking use included cigarettes. He quit smokeless tobacco use about 33 years ago.  His smokeless tobacco use included chew. He reports that he does not drink alcohol and does not use drugs.  ROS: All other review of systems were reviewed and are negative except what is noted above in HPI  Physical Exam: BP 122/78   Pulse 92   Constitutional:  Alert and oriented, No acute distress. HEENT: [place] AT, moist mucus membranes.  Trachea midline, no masses. Cardiovascular: No clubbing, cyanosis, or edema. Respiratory: Normal respiratory effort, no increased work of breathing. GI: Abdomen is soft, nontender, nondistended, no abdominal masses GU: No CVA tenderness.  Lymph: No cervical or inguinal lymphadenopathy. Skin: No rashes, bruises or suspicious lesions. Neurologic: Grossly intact, no focal deficits, moving all 4 extremities. Psychiatric: Normal mood and affect.  Laboratory Data: Lab Results  Component Value Date   WBC 20.2 (H) 10/20/2021   HGB 13.7 10/20/2021   HCT 42.2 10/20/2021   MCV 92.1 10/20/2021   PLT 195 10/20/2021    Lab Results  Component Value Date   CREATININE 0.95 06/16/2022    No results  found for: PSA  No results found for: TESTOSTERONE  Lab Results  Component Value Date   HGBA1C 6.4 (H) 06/16/2022    Urinalysis    Component Value Date/Time   COLORURINE YELLOW 10/20/2021 1616   APPEARANCEUR Clear 01/24/2024 1137   LABSPEC 1.023 10/20/2021 1616   PHURINE 5.0 10/20/2021 1616   GLUCOSEU Negative 01/24/2024 1137   HGBUR SMALL (A) 10/20/2021 1616   BILIRUBINUR Negative 01/24/2024 1137   KETONESUR NEGATIVE 10/20/2021 1616   PROTEINUR Negative 01/24/2024 1137   PROTEINUR 100 (A) 10/20/2021 1616   NITRITE Negative 01/24/2024 1137   NITRITE  NEGATIVE 10/20/2021 1616   LEUKOCYTESUR Negative 01/24/2024 1137   LEUKOCYTESUR LARGE (A) 10/20/2021 1616    Lab Results  Component Value Date   LABMICR Comment 01/24/2024   BACTERIA RARE (A) 10/20/2021    Pertinent Imaging: *** No results found for this or any previous visit.  No results found for this or any previous visit.  No results found for this or any previous visit.  No results found for this or any previous visit.  No results found for this or any previous visit.  No results found for this or any previous visit.  No results found for this or any previous visit.  No results found for this or any previous visit.   Assessment & Plan:    1. Epididymo-orchitis with abscess (Primary) ***  2. Wound, open, groin, complicated, initial encounter ***   No follow-ups on file.  Ryan Clara, MD  [facility] [place]      [1]  Allergies Allergen Reactions   Tamsulosin Other (See Comments)    Abdominal bloating, nausea, fatigue, dysnea   "

## 2024-09-10 ENCOUNTER — Encounter: Payer: Self-pay | Admitting: Urology

## 2024-09-10 NOTE — Patient Instructions (Signed)
 Skin Abscess    A skin abscess is an infected spot of skin. It can have pus in it. An abscess can happen in any part of your body.  Some abscesses break open (rupture) on their own. Most keep getting worse unless they are treated. If your abscess is not treated, the infection can spread deeper into your body and blood. This can make you feel sick.  What are the causes?  Germs that enter your skin. This may happen if you have:  A cut or scrape.  A wound from a needle or an insect bite.  Blocked oil or sweat glands.  A problem with the spot where your hair goes into your skin.  A fluid-filled sac called a cyst under your skin.  What increases the risk?  Having problems with how your blood moves through your body.  Having a weak body defense system (immune system).  Having diabetes.  Having dry and irritated skin.  Needing to get shots often.  Putting drugs into your body with a needle.  Having a splinter or something else in your skin.  Smoking.  What are the signs or symptoms?  A firm bump under your skin that hurts.  A bump with pus at the top.  Redness and swelling.  Warm or tender spots.  A sore on the skin.  How is this treated?  You may need to:  Put a heat pack or a warm, wet washcloth on the spot.  Have the pus drained.  Take antibiotics.  Follow these instructions at home:  Medicines  Take over-the-counter and prescription medicines only as told by your doctor.  If you were prescribed antibiotics, take them as told by your doctor. Do not stop taking them even if you start to feel better.  Abscess care    If you have an abscess that has not drained, put heat on it. Use the heat source that your doctor recommends, such as a moist heat pack or a heating pad.  Place a towel between your skin and the heat source.  Leave the heat on for 20-30 minutes.  If your skin turns bright red, take off the heat right away to prevent burns. The risk of burns is higher if you cannot feel pain, heat, or cold.  Follow  instructions from your doctor about how to take care of your abscess. Make sure you:  Cover the abscess with a bandage.  Wash your hands with soap and water for at least 20 seconds before and after you change your bandage. If you cannot use soap and water, use hand sanitizer.  Change your bandage as told by your doctor.  Check your abscess every day for signs that the infection is getting worse. Check for:  More redness, swelling, or pain.  More fluid or blood.  Warmth.  More pus or a worse smell.  General instructions  To keep the infection from spreading:  Do not share personal items or towels.  Do not go in a hot tub with others.  Avoid making skin contact with others.  Be careful when you get rid of used bandages or any pus from the abscess.  Do not smoke or use any products that contain nicotine or tobacco. If you need help quitting, ask your doctor.  Contact a doctor if:  You see red streaks on your skin near the abscess.  You have any signs of worse infection.  You vomit every time you eat or drink.  You have  a fever, chills, or muscle aches.  The cyst or abscess comes back.  Get help right away if:  You have very bad pain.  You make less pee (urine) than normal.  This information is not intended to replace advice given to you by your health care provider. Make sure you discuss any questions you have with your health care provider.  Document Revised: 03/30/2022 Document Reviewed: 03/30/2022  Elsevier Patient Education  2024 [employer].

## 2025-03-21 ENCOUNTER — Ambulatory Visit: Admitting: Urology
